# Patient Record
Sex: Female | Born: 1955 | Hispanic: Yes | Marital: Married | Smoking: Never smoker
Health system: Southern US, Community
[De-identification: ages and names within clinical notes are randomized; demographics above are authoritative.]

## PROBLEM LIST (undated history)

## (undated) DIAGNOSIS — E785 Hyperlipidemia, unspecified: Secondary | ICD-10-CM

## (undated) DIAGNOSIS — C569 Malignant neoplasm of unspecified ovary: Secondary | ICD-10-CM

## (undated) DIAGNOSIS — G43909 Migraine, unspecified, not intractable, without status migrainosus: Secondary | ICD-10-CM

## (undated) DIAGNOSIS — I10 Essential (primary) hypertension: Secondary | ICD-10-CM

## (undated) DIAGNOSIS — E119 Type 2 diabetes mellitus without complications: Secondary | ICD-10-CM

## (undated) HISTORY — DX: Type 2 diabetes mellitus without complications: E11.9

## (undated) HISTORY — DX: Hyperlipidemia, unspecified: E78.5

## (undated) HISTORY — DX: Essential (primary) hypertension: I10

## (undated) HISTORY — DX: Migraine, unspecified, not intractable, without status migrainosus: G43.909

---

## 1988-04-10 HISTORY — PX: BREAST BIOPSY: SHX20

## 2003-04-11 DIAGNOSIS — C569 Malignant neoplasm of unspecified ovary: Secondary | ICD-10-CM

## 2003-04-11 HISTORY — PX: ABDOMINAL HYSTERECTOMY: SHX81

## 2003-04-11 HISTORY — DX: Malignant neoplasm of unspecified ovary: C56.9

## 2005-03-01 ENCOUNTER — Ambulatory Visit: Payer: Self-pay | Admitting: Gynecologic Oncology

## 2005-03-08 ENCOUNTER — Other Ambulatory Visit: Payer: Self-pay

## 2005-03-15 ENCOUNTER — Inpatient Hospital Stay: Payer: Self-pay | Admitting: Unknown Physician Specialty

## 2005-03-29 ENCOUNTER — Ambulatory Visit: Payer: Self-pay | Admitting: Gynecologic Oncology

## 2005-04-12 ENCOUNTER — Ambulatory Visit: Payer: Self-pay | Admitting: Internal Medicine

## 2005-05-11 ENCOUNTER — Ambulatory Visit: Payer: Self-pay | Admitting: Internal Medicine

## 2005-06-08 ENCOUNTER — Ambulatory Visit: Payer: Self-pay | Admitting: Internal Medicine

## 2005-07-09 ENCOUNTER — Ambulatory Visit: Payer: Self-pay | Admitting: Internal Medicine

## 2005-08-08 ENCOUNTER — Ambulatory Visit: Payer: Self-pay | Admitting: Internal Medicine

## 2005-09-19 ENCOUNTER — Ambulatory Visit: Payer: Self-pay | Admitting: Internal Medicine

## 2005-10-08 ENCOUNTER — Ambulatory Visit: Payer: Self-pay | Admitting: Internal Medicine

## 2005-11-14 ENCOUNTER — Ambulatory Visit: Payer: Self-pay | Admitting: Internal Medicine

## 2005-12-09 ENCOUNTER — Ambulatory Visit: Payer: Self-pay | Admitting: Internal Medicine

## 2005-12-26 ENCOUNTER — Ambulatory Visit: Payer: Self-pay | Admitting: Gynecologic Oncology

## 2006-01-08 ENCOUNTER — Ambulatory Visit: Payer: Self-pay | Admitting: Internal Medicine

## 2006-02-08 ENCOUNTER — Ambulatory Visit: Payer: Self-pay | Admitting: Internal Medicine

## 2006-03-14 ENCOUNTER — Ambulatory Visit: Payer: Self-pay | Admitting: Internal Medicine

## 2006-04-10 ENCOUNTER — Ambulatory Visit: Payer: Self-pay | Admitting: Internal Medicine

## 2006-05-22 ENCOUNTER — Ambulatory Visit: Payer: Self-pay | Admitting: Internal Medicine

## 2006-06-09 ENCOUNTER — Ambulatory Visit: Payer: Self-pay | Admitting: Internal Medicine

## 2006-06-19 ENCOUNTER — Ambulatory Visit: Payer: Self-pay | Admitting: Internal Medicine

## 2006-07-31 ENCOUNTER — Ambulatory Visit: Payer: Self-pay | Admitting: Gynecologic Oncology

## 2006-08-09 ENCOUNTER — Ambulatory Visit: Payer: Self-pay | Admitting: Gynecologic Oncology

## 2006-11-09 ENCOUNTER — Ambulatory Visit: Payer: Self-pay | Admitting: Internal Medicine

## 2006-11-20 ENCOUNTER — Ambulatory Visit: Payer: Self-pay | Admitting: Internal Medicine

## 2006-12-10 ENCOUNTER — Ambulatory Visit: Payer: Self-pay | Admitting: Internal Medicine

## 2007-03-11 ENCOUNTER — Ambulatory Visit: Payer: Self-pay | Admitting: Internal Medicine

## 2007-03-25 ENCOUNTER — Ambulatory Visit: Payer: Self-pay | Admitting: Gynecologic Oncology

## 2007-03-26 ENCOUNTER — Ambulatory Visit: Payer: Self-pay | Admitting: Internal Medicine

## 2007-04-11 ENCOUNTER — Ambulatory Visit: Payer: Self-pay | Admitting: Internal Medicine

## 2007-05-12 ENCOUNTER — Ambulatory Visit: Payer: Self-pay | Admitting: Internal Medicine

## 2007-05-27 ENCOUNTER — Ambulatory Visit: Payer: Self-pay | Admitting: Internal Medicine

## 2007-06-09 ENCOUNTER — Ambulatory Visit: Payer: Self-pay | Admitting: Internal Medicine

## 2007-06-18 ENCOUNTER — Ambulatory Visit: Payer: Self-pay

## 2007-07-16 ENCOUNTER — Ambulatory Visit: Payer: Self-pay | Admitting: Internal Medicine

## 2007-07-23 ENCOUNTER — Ambulatory Visit: Payer: Self-pay

## 2007-08-09 ENCOUNTER — Ambulatory Visit: Payer: Self-pay | Admitting: Internal Medicine

## 2007-09-09 ENCOUNTER — Ambulatory Visit: Payer: Self-pay | Admitting: Internal Medicine

## 2007-09-26 ENCOUNTER — Ambulatory Visit: Payer: Self-pay | Admitting: Internal Medicine

## 2007-10-09 ENCOUNTER — Ambulatory Visit: Payer: Self-pay | Admitting: Internal Medicine

## 2007-11-28 ENCOUNTER — Ambulatory Visit: Payer: Self-pay | Admitting: Internal Medicine

## 2007-12-10 ENCOUNTER — Ambulatory Visit: Payer: Self-pay | Admitting: Internal Medicine

## 2008-01-27 ENCOUNTER — Ambulatory Visit: Payer: Self-pay | Admitting: Internal Medicine

## 2008-02-09 ENCOUNTER — Ambulatory Visit: Payer: Self-pay | Admitting: Internal Medicine

## 2008-03-10 ENCOUNTER — Ambulatory Visit: Payer: Self-pay | Admitting: Internal Medicine

## 2008-03-30 ENCOUNTER — Ambulatory Visit: Payer: Self-pay | Admitting: Internal Medicine

## 2008-04-10 ENCOUNTER — Ambulatory Visit: Payer: Self-pay | Admitting: Internal Medicine

## 2008-06-01 ENCOUNTER — Ambulatory Visit: Payer: Self-pay | Admitting: Internal Medicine

## 2008-06-08 ENCOUNTER — Ambulatory Visit: Payer: Self-pay | Admitting: Internal Medicine

## 2008-07-09 ENCOUNTER — Ambulatory Visit: Payer: Self-pay | Admitting: Internal Medicine

## 2008-08-03 ENCOUNTER — Ambulatory Visit: Payer: Self-pay | Admitting: Internal Medicine

## 2008-08-08 ENCOUNTER — Ambulatory Visit: Payer: Self-pay | Admitting: Internal Medicine

## 2008-10-08 ENCOUNTER — Ambulatory Visit: Payer: Self-pay | Admitting: Internal Medicine

## 2008-11-18 ENCOUNTER — Ambulatory Visit: Payer: Self-pay

## 2008-12-01 ENCOUNTER — Ambulatory Visit: Payer: Self-pay | Admitting: Internal Medicine

## 2008-12-09 ENCOUNTER — Ambulatory Visit: Payer: Self-pay | Admitting: Internal Medicine

## 2009-01-08 ENCOUNTER — Ambulatory Visit: Payer: Self-pay | Admitting: Internal Medicine

## 2009-02-02 ENCOUNTER — Ambulatory Visit: Payer: Self-pay | Admitting: Internal Medicine

## 2009-02-08 ENCOUNTER — Ambulatory Visit: Payer: Self-pay | Admitting: Internal Medicine

## 2009-03-10 ENCOUNTER — Ambulatory Visit: Payer: Self-pay | Admitting: Internal Medicine

## 2009-04-10 ENCOUNTER — Ambulatory Visit: Payer: Self-pay | Admitting: Internal Medicine

## 2009-05-02 ENCOUNTER — Emergency Department: Payer: Self-pay

## 2009-06-02 ENCOUNTER — Ambulatory Visit: Payer: Self-pay | Admitting: Internal Medicine

## 2009-06-08 ENCOUNTER — Ambulatory Visit: Payer: Self-pay | Admitting: Internal Medicine

## 2009-07-09 ENCOUNTER — Ambulatory Visit: Payer: Self-pay | Admitting: Internal Medicine

## 2009-08-02 ENCOUNTER — Ambulatory Visit: Payer: Self-pay | Admitting: Internal Medicine

## 2009-08-08 ENCOUNTER — Ambulatory Visit: Payer: Self-pay | Admitting: Internal Medicine

## 2009-11-01 ENCOUNTER — Ambulatory Visit: Payer: Self-pay | Admitting: Internal Medicine

## 2009-11-02 LAB — CA 125: CA 125: 15.9 U/mL (ref 0.0–34.0)

## 2009-11-08 ENCOUNTER — Ambulatory Visit: Payer: Self-pay | Admitting: Internal Medicine

## 2010-01-31 ENCOUNTER — Ambulatory Visit: Payer: Self-pay | Admitting: Internal Medicine

## 2010-02-08 ENCOUNTER — Ambulatory Visit: Payer: Self-pay | Admitting: Internal Medicine

## 2010-02-09 ENCOUNTER — Ambulatory Visit: Payer: Self-pay | Admitting: Unknown Physician Specialty

## 2010-06-16 ENCOUNTER — Ambulatory Visit: Payer: Self-pay | Admitting: Internal Medicine

## 2010-07-10 ENCOUNTER — Ambulatory Visit: Payer: Self-pay | Admitting: Internal Medicine

## 2010-10-03 ENCOUNTER — Ambulatory Visit: Payer: Self-pay | Admitting: Internal Medicine

## 2010-10-09 ENCOUNTER — Ambulatory Visit: Payer: Self-pay | Admitting: Internal Medicine

## 2011-02-01 ENCOUNTER — Ambulatory Visit: Payer: Self-pay

## 2011-02-01 ENCOUNTER — Ambulatory Visit: Payer: Self-pay | Admitting: Internal Medicine

## 2011-02-02 LAB — CA 125: CA 125: 14.9 U/mL (ref 0.0–34.0)

## 2011-02-09 ENCOUNTER — Ambulatory Visit: Payer: Self-pay | Admitting: Internal Medicine

## 2011-02-24 ENCOUNTER — Encounter: Payer: Self-pay | Admitting: Internal Medicine

## 2011-06-07 ENCOUNTER — Ambulatory Visit: Payer: Self-pay | Admitting: Internal Medicine

## 2011-06-08 LAB — CA 125: CA 125: 18 U/mL (ref 0.0–34.0)

## 2011-06-09 ENCOUNTER — Ambulatory Visit: Payer: Self-pay | Admitting: Internal Medicine

## 2011-10-04 ENCOUNTER — Ambulatory Visit: Payer: Self-pay | Admitting: Internal Medicine

## 2011-10-05 LAB — CA 125: CA 125: 17.1 U/mL (ref 0.0–34.0)

## 2011-10-09 ENCOUNTER — Ambulatory Visit: Payer: Self-pay | Admitting: Internal Medicine

## 2012-02-08 ENCOUNTER — Ambulatory Visit: Payer: Self-pay | Admitting: Internal Medicine

## 2012-02-08 LAB — SEDIMENTATION RATE: Erythrocyte Sed Rate: 9 mm/hr (ref 0–30)

## 2012-02-08 LAB — CBC CANCER CENTER
Basophil #: 0 x10 3/mm (ref 0.0–0.1)
Eosinophil #: 0.1 x10 3/mm (ref 0.0–0.7)
Eosinophil %: 1.5 %
HCT: 39.2 % (ref 35.0–47.0)
HGB: 13.1 g/dL (ref 12.0–16.0)
Lymphocyte #: 1.7 x10 3/mm (ref 1.0–3.6)
MCHC: 33.5 g/dL (ref 32.0–36.0)
MCV: 81 fL (ref 80–100)
Monocyte %: 8.2 %
Platelet: 185 x10 3/mm (ref 150–440)
RBC: 4.82 10*6/uL (ref 3.80–5.20)
RDW: 14.3 % (ref 11.5–14.5)

## 2012-02-08 LAB — COMPREHENSIVE METABOLIC PANEL
Alkaline Phosphatase: 74 U/L (ref 50–136)
Anion Gap: 6 — ABNORMAL LOW (ref 7–16)
Calcium, Total: 8.9 mg/dL (ref 8.5–10.1)
Chloride: 106 mmol/L (ref 98–107)
Co2: 31 mmol/L (ref 21–32)
EGFR (African American): >60
Potassium: 4.2 mmol/L (ref 3.5–5.1)
SGOT(AST): 14 U/L — ABNORMAL LOW (ref 15–37)
Total Protein: 7.4 g/dL (ref 6.4–8.2)

## 2012-02-09 ENCOUNTER — Ambulatory Visit: Payer: Self-pay | Admitting: Internal Medicine

## 2012-02-09 LAB — CA 125: CA 125: 18.6 U/mL (ref 0.0–34.0)

## 2012-03-10 ENCOUNTER — Ambulatory Visit: Payer: Self-pay | Admitting: Internal Medicine

## 2012-05-11 ENCOUNTER — Ambulatory Visit: Payer: Self-pay | Admitting: Internal Medicine

## 2012-05-30 LAB — CA 125: CA 125: 19 U/mL (ref 0.0–34.0)

## 2012-06-08 ENCOUNTER — Ambulatory Visit: Payer: Self-pay | Admitting: Internal Medicine

## 2012-09-19 ENCOUNTER — Ambulatory Visit: Payer: Self-pay | Admitting: Internal Medicine

## 2012-10-08 ENCOUNTER — Ambulatory Visit: Payer: Self-pay | Admitting: Internal Medicine

## 2013-02-06 ENCOUNTER — Ambulatory Visit: Payer: Self-pay | Admitting: Internal Medicine

## 2013-02-07 LAB — CA 125: CA 125: 19.6 U/mL (ref 0.0–34.0)

## 2013-02-08 ENCOUNTER — Ambulatory Visit: Payer: Self-pay | Admitting: Internal Medicine

## 2013-06-04 ENCOUNTER — Ambulatory Visit: Payer: Self-pay | Admitting: Internal Medicine

## 2013-06-10 ENCOUNTER — Ambulatory Visit: Payer: Self-pay | Admitting: Internal Medicine

## 2013-06-11 LAB — CA 125: CA 125: 17 U/mL (ref 0.0–34.0)

## 2013-06-26 DIAGNOSIS — C569 Malignant neoplasm of unspecified ovary: Secondary | ICD-10-CM | POA: Insufficient documentation

## 2013-06-26 DIAGNOSIS — H409 Unspecified glaucoma: Secondary | ICD-10-CM | POA: Insufficient documentation

## 2013-06-26 DIAGNOSIS — I10 Essential (primary) hypertension: Secondary | ICD-10-CM | POA: Insufficient documentation

## 2013-07-09 ENCOUNTER — Ambulatory Visit: Payer: Self-pay | Admitting: Internal Medicine

## 2013-07-29 ENCOUNTER — Ambulatory Visit: Payer: Self-pay | Admitting: Family Medicine

## 2013-09-16 ENCOUNTER — Ambulatory Visit: Payer: Self-pay | Admitting: Family Medicine

## 2013-10-02 ENCOUNTER — Ambulatory Visit: Payer: Self-pay | Admitting: Internal Medicine

## 2013-10-03 LAB — CA 125: CA 125: 17.6 U/mL (ref 0.0–34.0)

## 2013-10-08 ENCOUNTER — Ambulatory Visit: Payer: Self-pay | Admitting: Internal Medicine

## 2014-02-03 ENCOUNTER — Ambulatory Visit: Payer: Self-pay | Admitting: Internal Medicine

## 2014-02-04 LAB — CA 125: CA 125: 18.6 U/mL (ref 0.0–34.0)

## 2014-02-08 ENCOUNTER — Ambulatory Visit: Payer: Self-pay | Admitting: Internal Medicine

## 2014-10-27 ENCOUNTER — Telehealth: Payer: Self-pay | Admitting: *Deleted

## 2014-10-27 ENCOUNTER — Encounter: Payer: Self-pay | Admitting: *Deleted

## 2014-10-27 NOTE — Telephone Encounter (Signed)
Letter of Release from Practice and patient's last Office Note faxed to Dr. Nadine Counts at [facility].Marland KitchenMarland Kitchen

## 2014-12-16 ENCOUNTER — Other Ambulatory Visit: Payer: Self-pay | Admitting: Family Medicine

## 2014-12-16 DIAGNOSIS — G43009 Migraine without aura, not intractable, without status migrainosus: Secondary | ICD-10-CM

## 2014-12-17 ENCOUNTER — Other Ambulatory Visit: Payer: Self-pay | Admitting: Family Medicine

## 2014-12-17 DIAGNOSIS — I10 Essential (primary) hypertension: Secondary | ICD-10-CM

## 2014-12-22 ENCOUNTER — Ambulatory Visit (INDEPENDENT_AMBULATORY_CARE_PROVIDER_SITE_OTHER): Payer: BLUE CROSS/BLUE SHIELD | Admitting: Family Medicine

## 2014-12-22 ENCOUNTER — Encounter: Payer: Self-pay | Admitting: Family Medicine

## 2014-12-22 VITALS — BP 128/70 | HR 74 | Temp 98.6°F | Resp 16 | Ht 62.0 in | Wt 161.0 lb

## 2014-12-22 DIAGNOSIS — I10 Essential (primary) hypertension: Secondary | ICD-10-CM | POA: Diagnosis not present

## 2014-12-22 DIAGNOSIS — Z0000 Encounter for general adult medical examination without abnormal findings: Secondary | ICD-10-CM | POA: Insufficient documentation

## 2014-12-22 DIAGNOSIS — E8881 Metabolic syndrome: Secondary | ICD-10-CM

## 2014-12-22 DIAGNOSIS — Z1239 Encounter for other screening for malignant neoplasm of breast: Secondary | ICD-10-CM | POA: Insufficient documentation

## 2014-12-22 DIAGNOSIS — R1011 Right upper quadrant pain: Secondary | ICD-10-CM

## 2014-12-22 DIAGNOSIS — E119 Type 2 diabetes mellitus without complications: Secondary | ICD-10-CM | POA: Diagnosis not present

## 2014-12-22 DIAGNOSIS — M25569 Pain in unspecified knee: Secondary | ICD-10-CM | POA: Insufficient documentation

## 2014-12-22 DIAGNOSIS — G43009 Migraine without aura, not intractable, without status migrainosus: Secondary | ICD-10-CM | POA: Diagnosis not present

## 2014-12-22 DIAGNOSIS — Z113 Encounter for screening for infections with a predominantly sexual mode of transmission: Secondary | ICD-10-CM | POA: Insufficient documentation

## 2014-12-22 DIAGNOSIS — E7800 Pure hypercholesterolemia, unspecified: Secondary | ICD-10-CM | POA: Insufficient documentation

## 2014-12-22 DIAGNOSIS — E785 Hyperlipidemia, unspecified: Secondary | ICD-10-CM

## 2014-12-22 DIAGNOSIS — Z789 Other specified health status: Secondary | ICD-10-CM | POA: Insufficient documentation

## 2014-12-22 DIAGNOSIS — Z8543 Personal history of malignant neoplasm of ovary: Secondary | ICD-10-CM | POA: Insufficient documentation

## 2014-12-22 DIAGNOSIS — Z1211 Encounter for screening for malignant neoplasm of colon: Secondary | ICD-10-CM | POA: Insufficient documentation

## 2014-12-22 DIAGNOSIS — M1710 Unilateral primary osteoarthritis, unspecified knee: Secondary | ICD-10-CM | POA: Insufficient documentation

## 2014-12-22 DIAGNOSIS — Z23 Encounter for immunization: Secondary | ICD-10-CM

## 2014-12-22 DIAGNOSIS — S29011A Strain of muscle and tendon of front wall of thorax, initial encounter: Secondary | ICD-10-CM | POA: Insufficient documentation

## 2014-12-22 DIAGNOSIS — M179 Osteoarthritis of knee, unspecified: Secondary | ICD-10-CM | POA: Insufficient documentation

## 2014-12-22 DIAGNOSIS — O2690 Pregnancy related conditions, unspecified, unspecified trimester: Secondary | ICD-10-CM | POA: Insufficient documentation

## 2014-12-22 DIAGNOSIS — R87610 Atypical squamous cells of undetermined significance on cytologic smear of cervix (ASC-US): Secondary | ICD-10-CM | POA: Insufficient documentation

## 2014-12-22 HISTORY — DX: Hyperlipidemia, unspecified: E78.5

## 2014-12-22 MED ORDER — ATORVASTATIN CALCIUM 20 MG PO TABS: 20.0000 mg | ORAL_TABLET | Freq: Every day | ORAL | Status: DC

## 2014-12-22 MED ORDER — LISINOPRIL-HYDROCHLOROTHIAZIDE 10-12.5 MG PO TABS: 1.0000 | ORAL_TABLET | Freq: Every day | ORAL | Status: DC

## 2014-12-22 MED ORDER — SUMATRIPTAN SUCCINATE 50 MG PO TABS
50.0000 mg | ORAL_TABLET | Freq: Once | ORAL | Status: DC
Start: 2014-12-22 — End: 2016-08-11

## 2014-12-22 MED ORDER — METFORMIN HCL 500 MG PO TABS: 500.0000 mg | ORAL_TABLET | Freq: Every day | ORAL | Status: DC

## 2014-12-22 NOTE — Progress Notes (Signed)
Name: Jackie Fox   MRN: [id number]    DOB: 09/12/1955   Date:12/22/2014       Progress Note  Subjective  Chief Complaint  Chief Complaint  Patient presents with  . Medication Refill    patient needs a refill of her metformin, imitrex and lisinopril-hctz    HPI  Jackie Fox is a 59 year old Spanish speaking female with a past medical history significant for obesity, Diabetes Type II, HLD, HTN, Migraine headaches, history of ovarian cancer. She was first diagnosed with Diabetes Type II several years ago and has had a hard time with consistent glycemic control due to compliance. Today she reports using her Metformin daily. Her diet is not consistent with a diabetic diet. Related symptoms include fatigue and do not include paresthesia, polydipsia, polyuria, wounds, ulcers and gastroparesis. Current treatment includes Metformin 500 mg one a day. By report there is consistent compliance with treatment medications and she is still struggling with diet. Last diabetic eye exam is unknown. For her Migraine headaches she uses Sumatriptan as needed which is working well. For her HTN she is taking lisinopril-hctz 10-12.5 mg one a day with no reported chest pain, palpitations, edema. For her HLD she was previously prescribed a statin medication but has since self discontinued the medication it seems.  Today she complains of intermitent RUQ pain mostly with eating cheese. Not associated with nausea or vomiting. Onset several months ago. Non radiation. Pain described as achy cramps. No change in stool color or content.   Patient Active Problem List   Diagnosis Date Noted  . Atypical squamous cells of undetermined significance (ASC-US) on cervical Pap smear 12/22/2014  . Controlled diabetes mellitus type II without complication 62/94/7654  . H/O ovarian cancer 12/22/2014  . Hypercholesteremia 12/22/2014  . Dysmetabolic syndrome 65/06/5463  . Migraine without aura and responsive to treatment 12/22/2014   . Arthritis of knee, degenerative 12/22/2014  . Gonalgia 12/22/2014  . Glaucoma 06/26/2013  . Hypertension goal BP (blood pressure) < 140/90 06/26/2013  . Cancer of ovary 06/26/2013    Social History  Substance Use Topics  . Smoking status: Never Smoker   . Smokeless tobacco: Not on file  . Alcohol Use: No     Current outpatient prescriptions:  .  glucose blood (BAYER CONTOUR TEST) test strip, BAYER CONTOUR TEST (In Vitro Strip)  1 (one) Strip two times daily, as needed for 30 days  Quantity: 60;  Refills: 5   Ordered :30-Jun-2014  Bobetta Lime MD;  Started 30-Jun-2014 Active Comments: Medication taken as needed. , Disp: , Rfl:  .  lisinopril-hydrochlorothiazide (PRINZIDE,ZESTORETIC) 10-12.5 MG per tablet, TAKE 1 TABLET BY MOUTH EVERY DAY, Disp: 90 tablet, Rfl: 2 .  metFORMIN (GLUCOPHAGE) 500 MG tablet, , Disp: , Rfl: 4 .  SUMAtriptan (IMITREX) 50 MG tablet, TAKE 1 TAB BY MOUTH AS NEEDED FOR HEADACHE MAY REPEAT ONCE AFTER 2 HOURS, Disp: 20 tablet, Rfl: 5 .  timolol (BETIMOL) 0.5 % ophthalmic solution, 1 drop 2 (two) times daily., Disp: , Rfl:   Past Surgical History  Procedure Laterality Date  . Abdominal hysterectomy      No family history on file.  Allergies  Allergen Reactions  . Ibuprofen Swelling  . Shrimp [Shellfish Allergy]      Review of Systems  CONSTITUTIONAL: No significant weight changes, fever, chills, weakness or fatigue.  HEENT:  - Eyes: No visual changes.  - Ears: No auditory changes. No pain.  - Nose: No sneezing, congestion, runny nose. - Throat:  No sore throat. No changes in swallowing. SKIN: No rash or itching.  CARDIOVASCULAR: No chest pain, chest pressure or chest discomfort. No palpitations or edema.  RESPIRATORY: No shortness of breath, cough or sputum.  GASTROINTESTINAL: No anorexia, nausea, vomiting. No changes in bowel habits. RUQ abdominal pain. GENITOURINARY: No dysuria. No frequency. No discharge. NEUROLOGICAL: No headache,  dizziness, syncope, paralysis, ataxia, numbness or tingling in the extremities. No memory changes. No change in bowel or bladder control.  MUSCULOSKELETAL: No joint pain. No muscle pain. HEMATOLOGIC: No anemia, bleeding or bruising.  LYMPHATICS: No enlarged lymph nodes.  PSYCHIATRIC: No change in mood. No change in sleep pattern.  ENDOCRINOLOGIC: No reports of sweating, cold or heat intolerance. No polyuria or polydipsia.     Objective  BP 128/70 mmHg  Pulse 74  Temp(Src) 98.6 F (37 C) (Oral)  Resp 16  Ht 5\' 2"  (1.575 m)  Wt 161 lb (73.029 kg)  BMI 29.44 kg/m2  SpO2 97% Body mass index is 29.44 kg/(m^2).  Physical Exam  Constitutional: Patient overweight and well-nourished. In no distress.  HEENT:  - Head: Normocephalic and atraumatic.  - Ears: Bilateral TMs gray, no erythema or effusion - Nose: Nasal mucosa moist - Mouth/Throat: Oropharynx is clear and moist. No tonsillar hypertrophy or erythema. No post nasal drainage.  - Eyes: Conjunctivae clear, EOM movements normal. PERRLA. No scleral icterus.  Neck: Normal range of motion. Neck supple. No JVD present. No thyromegaly present.  Cardiovascular: Normal rate, regular rhythm and normal heart sounds.  No murmur heard.  Pulmonary/Chest: Effort normal and breath sounds normal. No respiratory distress. Abdomen: Soft, non tender, non distended (obese however with waist >35 inches), no reproducible pain in any quadrant, normal bowel sounds in all four quadrants.  Musculoskeletal: Normal range of motion bilateral UE and LE, no joint effusions. Peripheral vascular: Bilateral LE no edema. Neurological: CN II-XII grossly intact with no focal deficits. Alert and oriented to person, place, and time. Coordination, balance, strength, speech and gait are normal.  Skin: Skin is warm and dry. No rash noted. No erythema.  Psychiatric: Patient has a normal mood and affect. Behavior is normal in office today. Judgment and thought content normal  in office today.  Diabetic Foot Exam - Simple   Simple Foot Form  Diabetic Foot exam was performed with the following findings:  Yes 12/22/2014  8:39 AM  Visual Inspection  No deformities, no ulcerations, no other skin breakdown bilaterally:  Yes  Sensation Testing  Intact to touch and monofilament testing bilaterally:  Yes  Pulse Check  Posterior Tibialis and Dorsalis pulse intact bilaterally:  Yes  Comments      Assessment & Plan  1. Controlled diabetes mellitus type II without complication Patient's ZOX0R goal is <6.5% for stringent control.  Patient's Hba1c goal is <7% is acceptable   Encouraged patient to continue efforts on checking blood glucose on a daily basis. Fasting blood glucose control goal is 80-130mg /dL and post prandial blood glucose control is 180mg /dL.  Reviewed diet, exercise, lifestyle changes and current medication regimen pertaining to diabetes with the patient.   Reminded patient of the required annual dilated retinal exam.    - Hemoglobin A1c - TSH - metFORMIN (GLUCOPHAGE) 500 MG tablet; Take 1 tablet (500 mg total) by mouth daily.  Dispense: 90 tablet; Refill: 3  2. Hypertension goal BP (blood pressure) < 140/90 Clinically stable findings based on clinical exam and on review of any pertinent results. Recommended to patient that they continue their current regimen  with regular follow ups.  - CBC with Differential/Platelet - Comprehensive metabolic panel - TSH - lisinopril-hydrochlorothiazide (PRINZIDE,ZESTORETIC) 10-12.5 MG per tablet; Take 1 tablet by mouth daily.  Dispense: 90 tablet; Refill: 3  3. Hyperlipidemia LDL goal <100  - Lipid panel - TSH - atorvastatin (LIPITOR) 20 MG tablet; Take 1 tablet (20 mg total) by mouth daily.  Dispense: 90 tablet; Refill: 3  4. Dysmetabolic syndrome  - TSH  5. Screening for STD (sexually transmitted disease)  - HIV antibody - Hepatitis C antibody  6. Migraine without aura and without status  migrainosus, not intractable Clinically stable findings based on clinical exam and on review of any pertinent results. Recommended to patient that they continue their current regimen with regular follow ups.  - SUMAtriptan (IMITREX) 50 MG tablet; Take 1 tablet (50 mg total) by mouth once. May repeat in 2 hours if headache persists or recurs.  Dispense: 10 tablet; Refill: 5  7. Need for immunization against influenza  - Flu Vaccine QUAD 36+ mos PF IM (Fluarix & Fluzone Quad PF)  8. RUQ abdominal pain Likely gallbladder pathology, non persistent, recommended diet changes. If symptoms progress RTC for further testing.

## 2014-12-22 NOTE — Patient Instructions (Signed)
Colelitiasis (Cholelithiasis) La colelitiasis (tambin llamada clculos en la vescula) es una enfermedad en la que se forman piedras en la vescula. La vescula es un rgano que almacena la bilis que se forma en el hgado y que ayuda a Licensed conveyancer. Los clculos comienzan como pequeos cristales y lentamente se transforman en piedras. El dolor en la vescula ocurre cuando se producen espasmos y los clculos obstruyen el conducto. El dolor tambin se produce cuando una piedra sale por el conducto.  FACTORES DE RIESGO  Ser mujer.   Tener embarazos mltiples. Algunas veces los mdicos aconsejan extirpar los clculos biliares antes de futuros embarazos.   Ser obeso.  Dietas que incluyan comidas fritas y grasas.   Ser mayor de 44 aos y el aumento de la edad.   El uso prolongado de medicamentos que contengan hormonas femeninas.   Tener diabetes mellitus.   Prdida rpida de peso.   Historia familiar de clculos (herencia).  SNTOMAS  Nuseas.   Vmitos.  Dolor abdominal.   Piel amarilla (ictericia)   Dolor sbito. Puede persistir desde algunos minutos hasta algunas horas.  Cristy Hilts.   Sensibilidad al tacto. En algunos casos, cuando los clculos biliares no se mueven hacia el conducto biliar, las personas no sienten dolor ni presentan sntomas. Estos se denominan clculos "silenciosos".  TRATAMIENTO Los clculos silenciosos no requieren Clinical research associate. En los [employer], podr ser [place]. Las opciones de tratamiento son:  Dwaine Gale para extirpar la vescula. Es el tratamiento ms frecuente.  Medicamentos. No siempre dan resultado y pueden demorar entre 6 y 8 meses o ms en Chief of Staff.  Tratamiento con ondas de choque (litotricia biliar extracorporal). En este tratamiento, una mquina de ultrasonido enva ondas de choque a la vescula para destruir los clculos en pequeos fragmentos que luego podrn pasar a los intestinos o ser disueltas  con medicamentos. INSTRUCCIONES PARA EL CUIDADO EN EL HOGAR   Slo tome medicamentos de venta libre o recetados para Glass blower/designer, Health and safety inspector o bajar la fiebre, segn las indicaciones de su mdico.   Siga una dieta baja en grasas hasta que su mdico lo vea nuevamente. Las grasas hacen que la vescula se Location manager, lo que puede Orthoptist.   Concurra a las consultas de control con su mdico segn las indicaciones. Los ataques casi siempre son recurrentes y generalmente habr que someterse a una ciruga como [place].  SOLICITE ATENCIN MDICA DE INMEDIATO SI:   El dolor aumenta y no puede controlarlo con los medicamentos.   Tiene fiebre o sntomas persistentes durante ms de 2 - 3 das.   Tiene fiebre y los sntomas empeoran repentinamente.   Tiene nuseas o vmitos persistentes.  ASEGRESE DE QUE:   Comprende estas instrucciones.  Controlar su afeccin.  Recibir ayuda de inmediato si no mejora o si empeora. Document Released: 01/11/2006 Document Revised: 11/27/2012 [facility] Patient Information 2015 [place]. This information is not intended to replace advice given to you by your health care provider. Make sure you discuss any questions you have with your health care provider.

## 2014-12-30 LAB — CBC WITH DIFFERENTIAL/PLATELET
BASOS: 0 %
Basophils Absolute: 0 10*3/uL (ref 0.0–0.2)
EOS (ABSOLUTE): 0.1 10*3/uL (ref 0.0–0.4)
EOS: 2 %
HEMATOCRIT: 39.1 % (ref 34.0–46.6)
HEMOGLOBIN: 13.2 g/dL (ref 11.1–15.9)
Immature Grans (Abs): 0 10*3/uL (ref 0.0–0.1)
Immature Granulocytes: 0 %
LYMPHS ABS: 1.7 10*3/uL (ref 0.7–3.1)
Lymphs: 22 %
MCH: 27.9 pg (ref 26.6–33.0)
MCHC: 33.8 g/dL (ref 31.5–35.7)
MCV: 83 fL (ref 79–97)
MONOCYTES: 8 %
Monocytes Absolute: 0.6 10*3/uL (ref 0.1–0.9)
NEUTROS ABS: 5.1 10*3/uL (ref 1.4–7.0)
Neutrophils: 68 %
Platelets: 233 10*3/uL (ref 150–379)
RBC: 4.73 x10E6/uL (ref 3.77–5.28)
RDW: 15 % (ref 12.3–15.4)
WBC: 7.5 10*3/uL (ref 3.4–10.8)

## 2014-12-30 LAB — COMPREHENSIVE METABOLIC PANEL
ALBUMIN: 4 g/dL (ref 3.6–4.8)
ALK PHOS: 64 IU/L (ref 39–117)
ALT: 15 IU/L (ref 0–32)
AST: 15 IU/L (ref 0–40)
Albumin/Globulin Ratio: 1.5 (ref 1.1–2.5)
BILIRUBIN TOTAL: 0.6 mg/dL (ref 0.0–1.2)
BUN / CREAT RATIO: 26 (ref 11–26)
BUN: 12 mg/dL (ref 8–27)
CO2: 27 mmol/L (ref 18–29)
CREATININE: 0.46 mg/dL — AB (ref 0.57–1.00)
Calcium: 9.5 mg/dL (ref 8.7–10.3)
Chloride: 104 mmol/L (ref 97–108)
GFR calc non Af Amer: 108 mL/min/{1.73_m2} (ref >59)
GFR, EST AFRICAN AMERICAN: 124 mL/min/{1.73_m2} (ref >59)
GLOBULIN, TOTAL: 2.7 g/dL (ref 1.5–4.5)
Glucose: 104 mg/dL — ABNORMAL HIGH (ref 65–99)
Potassium: 5 mmol/L (ref 3.5–5.2)
SODIUM: 145 mmol/L — AB (ref 134–144)
TOTAL PROTEIN: 6.7 g/dL (ref 6.0–8.5)

## 2014-12-30 LAB — LIPID PANEL
CHOL/HDL RATIO: 4.2 ratio (ref 0.0–4.4)
Cholesterol, Total: 208 mg/dL — ABNORMAL HIGH (ref 100–199)
HDL: 49 mg/dL (ref >39)
LDL CALC: 134 mg/dL — AB (ref 0–99)
Triglycerides: 126 mg/dL (ref 0–149)
VLDL Cholesterol Cal: 25 mg/dL (ref 5–40)

## 2014-12-30 LAB — TSH: TSH: 3.24 u[IU]/mL (ref 0.450–4.500)

## 2014-12-30 LAB — HEMOGLOBIN A1C
Est. average glucose Bld gHb Est-mCnc: 134 mg/dL
HEMOGLOBIN A1C: 6.3 % — AB (ref 4.8–5.6)

## 2014-12-30 LAB — HIV ANTIBODY (ROUTINE TESTING W REFLEX): HIV Screen 4th Generation wRfx: NONREACTIVE

## 2014-12-30 LAB — HEPATITIS C ANTIBODY: Hep C Virus Ab: <0.1 s/co ratio (ref 0.0–0.9)

## 2015-03-23 ENCOUNTER — Encounter: Payer: Self-pay | Admitting: Family Medicine

## 2015-03-23 ENCOUNTER — Ambulatory Visit (INDEPENDENT_AMBULATORY_CARE_PROVIDER_SITE_OTHER): Payer: BLUE CROSS/BLUE SHIELD | Admitting: Family Medicine

## 2015-03-23 VITALS — BP 112/72 | HR 72 | Temp 98.4°F | Resp 12 | Wt 164.2 lb

## 2015-03-23 DIAGNOSIS — N390 Urinary tract infection, site not specified: Secondary | ICD-10-CM | POA: Insufficient documentation

## 2015-03-23 DIAGNOSIS — E785 Hyperlipidemia, unspecified: Secondary | ICD-10-CM

## 2015-03-23 DIAGNOSIS — E119 Type 2 diabetes mellitus without complications: Secondary | ICD-10-CM

## 2015-03-23 DIAGNOSIS — I10 Essential (primary) hypertension: Secondary | ICD-10-CM

## 2015-03-23 DIAGNOSIS — Z1231 Encounter for screening mammogram for malignant neoplasm of breast: Secondary | ICD-10-CM | POA: Diagnosis not present

## 2015-03-23 LAB — POCT URINALYSIS DIPSTICK
Bilirubin, UA: NEGATIVE
Glucose, UA: NEGATIVE
KETONES UA: NEGATIVE
Nitrite, UA: NEGATIVE
PROTEIN UA: NEGATIVE
SPEC GRAV UA: 1.015
UROBILINOGEN UA: 0.2
pH, UA: 6.5

## 2015-03-23 LAB — POCT UA - MICROALBUMIN: MICROALBUMIN (UR) POC: 50 mg/L

## 2015-03-23 MED ORDER — CIPROFLOXACIN HCL 500 MG PO TABS: 500.0000 mg | ORAL_TABLET | Freq: Two times a day (BID) | ORAL | Status: DC

## 2015-03-23 NOTE — Progress Notes (Signed)
Name: Jackie Fox   MRN: [id number]    DOB: [birth year]   Date:03/23/2015       Progress Note  Subjective  Chief Complaint  Chief Complaint  Patient presents with  . Diabetes    patient is here for her 4 month f/u  . Hypertension  . Hyperlipidemia  . Urinary Tract Infection    since Saturday patient reports of pain, pressure and burning upon urination  . Medication Refill    HPI  Jackie Fox is a 59 year old Spanish speaking female with a past medical history significant for obesity, Diabetes Type II, HLD, HTN, Migraine headaches, history of ovarian cancer. She was first diagnosed with Diabetes Type II several years ago and has had a hard time with consistent glycemic control due to compliance. Today she reports using her Metformin daily. Her diet is not consistent with a diabetic diet. Related symptoms include fatigue and do not include paresthesia, polydipsia, polyuria, wounds, ulcers and gastroparesis. Current treatment includes Metformin 500 mg one a day. By report there is consistent compliance with treatment medications and she is better with diet. Blood glucose ranges 80-95 in the am and after meals up to 140. Last diabetic eye exam is unknown. For her Migraine headaches she uses Sumatriptan as needed which is working well. For her HTN she is taking lisinopril-hctz 10-12.5 mg one a day with no reported chest pain, palpitations, edema. For her HLD she is prescribed atorvastatin 20 mg a day, not taking it, patient doesn't want it. Today she complains of dysuria, pelvic pressure and pain. No fevers or flank pain.  Past Medical History  Diagnosis Date  . Hypertension   . Migraine   . Diabetes mellitus without complication [facility])     Patient Active Problem List   Diagnosis Date Noted  . Atypical squamous cells of undetermined significance (ASC-US) on cervical Pap smear 12/22/2014  . Controlled diabetes mellitus type II without complication ([place]) 0000000  . H/O ovarian  cancer 12/22/2014  . Dysmetabolic syndrome 0000000  . Migraine without aura and responsive to treatment 12/22/2014  . Arthritis of knee, degenerative 12/22/2014  . Gonalgia 12/22/2014  . Hyperlipidemia LDL goal <100 12/22/2014  . Screening for STD (sexually transmitted disease) 12/22/2014  . Migraine without aura and without status migrainosus, not intractable 12/22/2014  . Need for immunization against influenza 12/22/2014  . RUQ abdominal pain 12/22/2014  . Glaucoma 06/26/2013  . Hypertension goal BP (blood pressure) < 140/90 06/26/2013  . Cancer of ovary ([place]) 06/26/2013    Social History  Substance Use Topics  . Smoking status: Never Smoker   . Smokeless tobacco: Not on file  . Alcohol Use: No     Current outpatient prescriptions:  .  atorvastatin (LIPITOR) 20 MG tablet, Take 1 tablet (20 mg total) by mouth daily., Disp: 90 tablet, Rfl: 3 .  glucose blood (BAYER CONTOUR TEST) test strip, BAYER CONTOUR TEST (In Vitro Strip)  1 (one) Strip two times daily, as needed for 30 days  Quantity: 60;  Refills: 5   Ordered :30-Jun-2014  Bobetta Lime MD;  Started 30-Jun-2014 Active Comments: Medication taken as needed. , Disp: , Rfl:  .  lisinopril-hydrochlorothiazide (PRINZIDE,ZESTORETIC) 10-12.5 MG per tablet, Take 1 tablet by mouth daily., Disp: 90 tablet, Rfl: 3 .  metFORMIN (GLUCOPHAGE) 500 MG tablet, Take 1 tablet (500 mg total) by mouth daily., Disp: 90 tablet, Rfl: 3 .  SUMAtriptan (IMITREX) 50 MG tablet, Take 1 tablet (50 mg total) by mouth once. May repeat  in 2 hours if headache persists or recurs. (Patient not taking: Reported on 03/23/2015), Disp: 10 tablet, Rfl: 5 .  timolol (BETIMOL) 0.5 % ophthalmic solution, 1 drop 2 (two) times daily., Disp: , Rfl:   Past Surgical History  Procedure Laterality Date  . Abdominal hysterectomy      History reviewed. No pertinent family history.  Allergies  Allergen Reactions  . Ibuprofen Swelling  . Shrimp [Shellfish Allergy]       Review of Systems  CONSTITUTIONAL: No significant weight changes, fever, chills, weakness or fatigue.  CARDIOVASCULAR: No chest pain, chest pressure or chest discomfort. No palpitations or edema.  RESPIRATORY: No shortness of breath, cough or sputum.  GASTROINTESTINAL: No anorexia, nausea, vomiting. No changes in bowel habits. No abdominal pain or blood.  GENITOURINARY: Yes dysuria. Yes frequency. No discharge. NEUROLOGICAL: No headache, dizziness, syncope, paralysis, ataxia, numbness or tingling in the extremities. No memory changes. No change in bowel or bladder control.  MUSCULOSKELETAL: No joint pain. No muscle pain. HEMATOLOGIC: No anemia, bleeding or bruising.  LYMPHATICS: No enlarged lymph nodes.  PSYCHIATRIC: No change in mood. No change in sleep pattern.  ENDOCRINOLOGIC: No reports of sweating, cold or heat intolerance. No polyuria or polydipsia.     Objective  BP 112/72 mmHg  Pulse 72  Temp(Src) 98.4 F (36.9 C) (Oral)  Resp 12  Wt 164 lb 3.2 oz (74.481 kg)  SpO2 98% Body mass index is 30.03 kg/(m^2).  Physical Exam  Constitutional: Patient appears well-developed and well-nourished. In no distress.  HEENT:  - Head: Normocephalic and atraumatic.  - Ears: Bilateral TMs gray, no erythema or effusion - Nose: Nasal mucosa moist - Mouth/Throat: Oropharynx is clear and moist. No tonsillar hypertrophy or erythema. No post nasal drainage.  - Eyes: Conjunctivae clear, EOM movements normal. PERRLA. No scleral icterus.  Neck: Normal range of motion. Neck supple. No JVD present. No thyromegaly present.  Cardiovascular: Normal rate, regular rhythm and normal heart sounds.  No murmur heard.  Pulmonary/Chest: Effort normal and breath sounds normal. No respiratory distress. Abdomen: Soft, normal bowel sounds in all four quadrants, some tenderness suprapubic area. Musculoskeletal: Normal range of motion bilateral UE and LE, no joint effusions. Peripheral vascular: Bilateral  LE no edema. Neurological: CN II-XII grossly intact with no focal deficits. Alert and oriented to person, place, and time. Coordination, balance, strength, speech and gait are normal.  Skin: Skin is warm and dry. No rash noted. No erythema.  Psychiatric: Patient has a normal mood and affect. Behavior is normal in office today. Judgment and thought content normal in office today.   Recent Results (from the past 2160 hour(s))  CBC with Differential/Platelet     Status: None   Collection Time: 12/29/14  9:45 AM  Result Value Ref Range   WBC 7.5 3.4 - 10.8 x10E3/uL   RBC 4.73 3.77 - 5.28 x10E6/uL   Hemoglobin 13.2 11.1 - 15.9 g/dL   Hematocrit 39.1 34.0 - 46.6 %   MCV 83 79 - 97 fL   MCH 27.9 26.6 - 33.0 pg   MCHC 33.8 31.5 - 35.7 g/dL   RDW 15.0 12.3 - 15.4 %   Platelets 233 150 - 379 x10E3/uL   Neutrophils 68 %   Lymphs 22 %   Monocytes 8 %   Eos 2 %   Basos 0 %   Neutrophils Absolute 5.1 1.4 - 7.0 x10E3/uL   Lymphocytes Absolute 1.7 0.7 - 3.1 x10E3/uL   Monocytes Absolute 0.6 0.1 - 0.9 x10E3/uL  EOS (ABSOLUTE) 0.1 0.0 - 0.4 x10E3/uL   Basophils Absolute 0.0 0.0 - 0.2 x10E3/uL   Immature Granulocytes 0 %   Immature Grans (Abs) 0.0 0.0 - 0.1 x10E3/uL  Comprehensive metabolic panel     Status: Abnormal   Collection Time: 12/29/14  9:45 AM  Result Value Ref Range   Glucose 104 (H) 65 - 99 mg/dL   BUN 12 8 - 27 mg/dL   Creatinine, Ser 0.46 (L) 0.57 - 1.00 mg/dL   GFR calc non Af Amer 108 >59 mL/min/1.73   GFR calc Af Amer 124 >59 mL/min/1.73   BUN/Creatinine Ratio 26 11 - 26   Sodium 145 (H) 134 - 144 mmol/L   Potassium 5.0 3.5 - 5.2 mmol/L   Chloride 104 97 - 108 mmol/L   CO2 27 18 - 29 mmol/L   Calcium 9.5 8.7 - 10.3 mg/dL   Total Protein 6.7 6.0 - 8.5 g/dL   Albumin 4.0 3.6 - 4.8 g/dL   Globulin, Total 2.7 1.5 - 4.5 g/dL   Albumin/Globulin Ratio 1.5 1.1 - 2.5   Bilirubin Total 0.6 0.0 - 1.2 mg/dL   Alkaline Phosphatase 64 39 - 117 IU/L   AST 15 0 - 40 IU/L   ALT 15 0  - 32 IU/L  Hemoglobin A1c     Status: Abnormal   Collection Time: 12/29/14  9:45 AM  Result Value Ref Range   Hgb A1c MFr Bld 6.3 (H) 4.8 - 5.6 %    Comment:          Pre-diabetes: 5.7 - 6.4          Diabetes: >6.4          Glycemic control for adults with diabetes: <7.0    Est. average glucose Bld gHb Est-mCnc 134 mg/dL  Lipid panel     Status: Abnormal   Collection Time: 12/29/14  9:45 AM  Result Value Ref Range   Cholesterol, Total 208 (H) 100 - 199 mg/dL   Triglycerides 126 0 - 149 mg/dL   HDL 49 >39 mg/dL    Comment: According to ATP-III Guidelines, HDL-C >59 mg/dL is considered a negative risk factor for CHD.    VLDL Cholesterol Cal 25 5 - 40 mg/dL   LDL Calculated 134 (H) 0 - 99 mg/dL   Chol/HDL Ratio 4.2 0.0 - 4.4 ratio units    Comment:                                   T. Chol/HDL Ratio                                             Men  Women                               1/2 Avg.Risk  3.4    3.3                                   Avg.Risk  5.0    4.4  2X Avg.Risk  9.6    7.1                                3X Avg.Risk 23.4   11.0   TSH     Status: None   Collection Time: 12/29/14  9:45 AM  Result Value Ref Range   TSH 3.240 0.450 - 4.500 uIU/mL  HIV antibody     Status: None   Collection Time: 12/29/14  9:45 AM  Result Value Ref Range   HIV Screen 4th Generation wRfx Non Reactive Non Reactive  Hepatitis C antibody     Status: None   Collection Time: 12/29/14  9:45 AM  Result Value Ref Range   Hep C Virus Ab <0.1 0.0 - 0.9 s/co ratio    Comment:                                   Negative:     < 0.8                              Indeterminate: 0.8 - 0.9                                   Positive:     > 0.9  The CDC recommends that a positive HCV antibody result  be followed up with a HCV Nucleic Acid Amplification  test [id number]).   POCT urinalysis dipstick     Status: Abnormal   Collection Time: 03/23/15  1:58 PM  Result Value Ref  Range   Color, UA yellow    Clarity, UA clear    Glucose, UA negative    Bilirubin, UA negative    Ketones, UA negative    Spec Grav, UA 1.015    Blood, UA moderate    pH, UA 6.5    Protein, UA negative    Urobilinogen, UA 0.2    Nitrite, UA negative    Leukocytes, UA small (1+) (A) Negative  POCT UA - Microalbumin     Status: Abnormal   Collection Time: 03/23/15  1:59 PM  Result Value Ref Range   Microalbumin Ur, POC 50 mg/L   Creatinine, POC  mg/dL   Albumin/Creatinine Ratio, Urine, POC       Assessment & Plan  1. Controlled type 2 diabetes mellitus without complication, without long-term current use of insulin ([place]) Lab work for Hba1c draw after 04/11/15 given today.    - POCT UA - Microalbumin - Hemoglobin A1c; Future - Hemoglobin A1c  2. UTI (lower urinary tract infection) Start Cipro, increase hydration.  - POCT urinalysis dipstick - Urine Culture - ciprofloxacin (CIPRO) 500 MG tablet; Take 1 tablet (500 mg total) by mouth 2 (two) times daily.  Dispense: 20 tablet; Refill: 0  3. Hyperlipidemia LDL goal <100 Patient refused statin.   4. Hypertension goal BP (blood pressure) < 140/90 Well controled.   5. Encounter for screening mammogram for malignant neoplasm of breast  - MM Digital Screening; Future

## 2015-03-25 LAB — URINE CULTURE: Organism ID, Bacteria: NO GROWTH

## 2015-03-29 ENCOUNTER — Telehealth: Payer: Self-pay | Admitting: Family Medicine

## 2015-03-29 NOTE — Telephone Encounter (Signed)
ERROR

## 2015-04-21 LAB — HEMOGLOBIN A1C
ESTIMATED AVERAGE GLUCOSE: 134 mg/dL
HEMOGLOBIN A1C: 6.3 % — AB (ref 4.8–5.6)

## 2015-05-11 ENCOUNTER — Ambulatory Visit
Admission: RE | Admit: 2015-05-11 | Discharge: 2015-05-11 | Disposition: A | Discharge status: Home / Self Care | Payer: BLUE CROSS/BLUE SHIELD | Source: Ambulatory Visit | Attending: Family Medicine | Admitting: Family Medicine

## 2015-05-11 DIAGNOSIS — Z1231 Encounter for screening mammogram for malignant neoplasm of breast: Secondary | ICD-10-CM | POA: Diagnosis not present

## 2015-05-11 HISTORY — DX: Malignant neoplasm of unspecified ovary: C56.9

## 2015-06-25 ENCOUNTER — Other Ambulatory Visit: Payer: Self-pay | Admitting: Family Medicine

## 2015-06-25 DIAGNOSIS — M25561 Pain in right knee: Secondary | ICD-10-CM

## 2015-06-25 DIAGNOSIS — M25562 Pain in left knee: Principal | ICD-10-CM

## 2015-09-21 ENCOUNTER — Ambulatory Visit: Payer: BLUE CROSS/BLUE SHIELD | Admitting: Family Medicine

## 2015-10-18 ENCOUNTER — Other Ambulatory Visit: Payer: Self-pay

## 2015-10-18 NOTE — Telephone Encounter (Signed)
Please let patient know that she does not need to check her sugars at this time We'll see her for her appointment (please schedule visit in the next month) No need for fingerstick blood sugars; I'll explain to her at our visit; thank you

## 2015-10-19 NOTE — Telephone Encounter (Signed)
Son [place]ne [place]) states she is out of the country on vacation but he will have her to give [place] a call when she returns later this week.

## 2016-02-02 ENCOUNTER — Other Ambulatory Visit: Payer: Self-pay

## 2016-02-02 DIAGNOSIS — E119 Type 2 diabetes mellitus without complications: Secondary | ICD-10-CM

## 2016-02-02 NOTE — Telephone Encounter (Signed)
Does patient need a refill?

## 2016-02-02 NOTE — Telephone Encounter (Signed)
Yes sorry

## 2016-02-03 MED ORDER — METFORMIN HCL 500 MG PO TABS: 500.0000 mg | ORAL_TABLET | Freq: Every day | ORAL | 0 refills | Status: DC

## 2016-02-03 NOTE — Telephone Encounter (Signed)
Last creatinine and A1c reviewed; upcoming appt in early Nov; Rx approved

## 2016-02-22 ENCOUNTER — Encounter: Payer: Self-pay | Admitting: Family Medicine

## 2016-02-22 ENCOUNTER — Ambulatory Visit (INDEPENDENT_AMBULATORY_CARE_PROVIDER_SITE_OTHER): Payer: BLUE CROSS/BLUE SHIELD | Admitting: Family Medicine

## 2016-02-22 VITALS — BP 110/80 | HR 73 | Temp 98.6°F | Resp 14 | Wt 153.7 lb

## 2016-02-22 DIAGNOSIS — R87610 Atypical squamous cells of undetermined significance on cytologic smear of cervix (ASC-US): Secondary | ICD-10-CM

## 2016-02-22 DIAGNOSIS — R634 Abnormal weight loss: Secondary | ICD-10-CM

## 2016-02-22 DIAGNOSIS — Z23 Encounter for immunization: Secondary | ICD-10-CM

## 2016-02-22 DIAGNOSIS — Z5181 Encounter for therapeutic drug level monitoring: Secondary | ICD-10-CM

## 2016-02-22 DIAGNOSIS — Z8543 Personal history of malignant neoplasm of ovary: Secondary | ICD-10-CM

## 2016-02-22 DIAGNOSIS — R1032 Left lower quadrant pain: Secondary | ICD-10-CM | POA: Diagnosis not present

## 2016-02-22 DIAGNOSIS — G43009 Migraine without aura, not intractable, without status migrainosus: Secondary | ICD-10-CM | POA: Diagnosis not present

## 2016-02-22 DIAGNOSIS — G8929 Other chronic pain: Secondary | ICD-10-CM | POA: Diagnosis not present

## 2016-02-22 DIAGNOSIS — I10 Essential (primary) hypertension: Secondary | ICD-10-CM

## 2016-02-22 DIAGNOSIS — E785 Hyperlipidemia, unspecified: Secondary | ICD-10-CM | POA: Diagnosis not present

## 2016-02-22 DIAGNOSIS — E119 Type 2 diabetes mellitus without complications: Secondary | ICD-10-CM | POA: Diagnosis not present

## 2016-02-22 LAB — CBC WITH DIFFERENTIAL/PLATELET
BASOS ABS: 0 {cells}/uL (ref 0–200)
BASOS PCT: 0 %
EOS ABS: 154 {cells}/uL (ref 15–500)
EOS PCT: 2 %
HCT: 42.1 % (ref 35.0–45.0)
HEMOGLOBIN: 13.9 g/dL (ref 11.7–15.5)
Lymphocytes Relative: 23 %
Lymphs Abs: 1771 cells/uL (ref 850–3900)
MCH: 27.7 pg (ref 27.0–33.0)
MCHC: 33 g/dL (ref 32.0–36.0)
MCV: 83.9 fL (ref 80.0–100.0)
MONO ABS: 693 {cells}/uL (ref 200–950)
MPV: 10.1 fL (ref 7.5–12.5)
Monocytes Relative: 9 %
Neutro Abs: 5082 cells/uL (ref 1500–7800)
Neutrophils Relative %: 66 %
Platelets: 295 10*3/uL (ref 140–400)
RBC: 5.02 MIL/uL (ref 3.80–5.10)
RDW: 14.2 % (ref 11.0–15.0)
WBC: 7.7 10*3/uL (ref 3.8–10.8)

## 2016-02-22 NOTE — Assessment & Plan Note (Signed)
Check TSH 

## 2016-02-22 NOTE — Assessment & Plan Note (Signed)
Check labs today, not truly fasting

## 2016-02-22 NOTE — Progress Notes (Signed)
BP 110/80   Pulse 73   Temp 98.6 F (37 C) (Oral)   Resp 14   Wt 153 lb 11.2 oz (69.7 kg)   SpO2 97%   BMI 28.11 kg/m    Subjective:    Patient ID: Jackie Fox, female    DOB: [birth year], 60 y.o.   MRN: [id number]  HPI: Jackie Fox is a 60 y.o. female  Chief Complaint  Patient presents with  . Medication Refill   Interpreter Chip Boer is here today; patient is new to me  She has had problem with LLQ pain; one month, comes and goes She had the same pain there, same pain and did complete hysterectomy When she goes to the bathroom, when having a BM; little pain; no blood in the stools Last colonoscopy more than 20 years ago Had a CT scan 11 years ago  Type 2 diabetes; used to have dry mouth but not now; no blurred vision; last eye exam was in July, glaucoma  High cholesterol; used to have cholesterol, but not on any medicines  Pain in the left heel; medial aspect  Migraines; every 2 months, uses triptan for relief; before the headaches, used to have aura, not any more  Stabbing pain in arms and legs; like a cramp just once in a while; good water drinker Flu shot today  Depression screen [facility] 2/9 02/22/2016 03/23/2015 12/22/2014  Decreased Interest 0 0 0  Down, Depressed, Hopeless 0 0 0  PHQ - 2 Score 0 0 0   Relevant past medical, surgical, family and social history reviewed Past Medical History:  Diagnosis Date  . Diabetes mellitus without complication ([place])   . Hypertension   . Migraine   . Ovarian cancer ([place]) 2005   Past Surgical History:  Procedure Laterality Date  . ABDOMINAL HYSTERECTOMY  2005  . BREAST BIOPSY Left 1990   Family History  Problem Relation Age of Onset  . Hyperlipidemia Mother   . Heart disease Mother   . Hypertension Mother   . Liver cancer Father   . Breast cancer Neg Hx    Social History  Substance Use Topics  . Smoking status: Never Smoker  . Smokeless tobacco: Never Used  . Alcohol use No    Interim medical history since  last visit reviewed. Allergies and medications reviewed  Review of Systems Per HPI unless specifically indicated above     Objective:    BP 110/80   Pulse 73   Temp 98.6 F (37 C) (Oral)   Resp 14   Wt 153 lb 11.2 oz (69.7 kg)   SpO2 97%   BMI 28.11 kg/m   Wt Readings from Last 3 Encounters:  02/22/16 153 lb 11.2 oz (69.7 kg)  03/23/15 164 lb 3.2 oz (74.5 kg)  12/22/14 161 lb (73 kg)    Physical Exam  Constitutional: She appears well-developed and well-nourished. No distress.  HENT:  Head: Normocephalic and atraumatic.  Eyes: EOM are normal. No scleral icterus.  Neck: No thyromegaly present.  Cardiovascular: Normal rate, regular rhythm and normal heart sounds.   No murmur heard. Pulmonary/Chest: Effort normal and breath sounds normal. No respiratory distress. She has no wheezes.  Abdominal: Soft. Bowel sounds are normal. She exhibits no distension. There is tenderness (LLQ).  Musculoskeletal: Normal range of motion. She exhibits no edema.  Neurological: She is alert. She exhibits normal muscle tone.  Skin: Skin is warm and dry. She is not diaphoretic. No pallor.  Psychiatric: She has a normal mood  and affect. Her behavior is normal. Judgment and thought content normal.   Diabetic Foot Form - Detailed   Diabetic Foot Exam - detailed Diabetic Foot exam was performed with the following findings:  Yes 02/22/2016  2:58 PM  Visual Foot Exam completed.:  Yes  Are the toenails ingrown?:  No Normal Range of Motion:  Yes Pulse Foot Exam completed.:  Yes  Right Dorsalis Pedis:  Present Left Dorsalis Pedis:  Present  Sensory Foot Exam Completed.:  Yes Semmes-Weinstein Monofilament Test R Site 1-Great Toe:  Pos L Site 1-Great Toe:  Pos  R Site 4:  Pos L Site 4:  Pos  R Site 5:  Pos L Site 5:  Pos          Assessment & Plan:   Problem List Items Addressed This Visit      Cardiovascular and Mediastinum   Migraine without aura and without status migrainosus, not  intractable (Chronic)    Not seeing aura; intermittent migraines, using triptan periodically; suggested magnesium      Hypertension goal BP (blood pressure) < 140/90 (Chronic)    Perfecto! Continue medicine        Endocrine   Controlled diabetes mellitus type II without complication (HCC) (Chronic)    Foot exam today; eye exam UTD; check A1c and urine microalbumin:Cr      Relevant Orders   Hemoglobin A1c (Completed)   Microalbumin / creatinine urine ratio (Completed)     Genitourinary   H/O ovarian cancer (Chronic)    Check CT scan      Relevant Orders   CA 125 (Completed)     Other   Medication monitoring encounter    Check labs      Relevant Orders   Magnesium (Completed)   Hyperlipidemia LDL goal <100 (Chronic)    Check labs today, not truly fasting      Relevant Orders   Lipid panel (Completed)   Chronic LLQ pain - Primary    Refer to GI for evaluation; order CT scan as well      Relevant Orders   CT Abdomen Pelvis W Contrast (Completed)   Ambulatory referral to Gastroenterology   CBC with Differential/Platelet (Completed)   COMPLETE METABOLIC PANEL WITH GFR (Completed)   Atypical squamous cells of undetermined significance (ASC-US) on cervical Pap smear    Managed by [facility], in notes prior to hysterectomy      Abnormal weight loss    Check TSH      Relevant Orders   TSH (Completed)    Other Visit Diagnoses    Needs flu shot       Relevant Orders   Flu Vaccine QUAD 36+ mos PF IM (Fluarix & Fluzone Quad PF) (Completed)      Follow up plan: Return in about 2 weeks (around 03/07/2016) for follow-up of labs and CT scan.  An after-visit summary was printed and given to the patient at [place].  Please see the patient instructions which may contain other information and recommendations beyond what is mentioned above in the assessment and plan.  Orders Placed This Encounter  Procedures  . CT Abdomen Pelvis W Contrast  . Flu Vaccine QUAD  36+ mos PF IM (Fluarix & Fluzone Quad PF)  . Magnesium  . Lipid panel  . CBC with Differential/Platelet  . COMPLETE METABOLIC PANEL WITH GFR  . TSH  . Hemoglobin A1c  . CA 125  . Microalbumin / creatinine urine ratio  . Ambulatory referral to Gastroenterology

## 2016-02-22 NOTE — Assessment & Plan Note (Signed)
Refer to GI for evaluation; order CT scan as well

## 2016-02-22 NOTE — Assessment & Plan Note (Signed)
Check labs 

## 2016-02-22 NOTE — Patient Instructions (Signed)
Please do see your eye doctor regularly, and have your eyes examined every year (or more often per his or her recommendation) Check your feet every night and let me know right away of any sores, infections, numbness, etc. Try to limit sweets, white bread, white rice, white potatoes It is okay with me for you to not check your fingerstick blood sugars (per [employer] of Endocrinology Best Practices), unless you are interested and feel it would be helpful for you Try to limit saturated fats in your diet (bologna, hot dogs, barbeque, cheeseburgers, hamburgers, steak, bacon, sausage, cheese, etc.) and get more fresh fruits, vegetables, and whole grains Return for follow-up soon to go over your labs and xray results

## 2016-02-22 NOTE — Assessment & Plan Note (Signed)
Foot exam today; eye exam UTD; check A1c and urine microalbumin:Cr

## 2016-02-22 NOTE — Assessment & Plan Note (Signed)
Not seeing aura; intermittent migraines, using triptan periodically; suggested magnesium

## 2016-02-22 NOTE — Assessment & Plan Note (Signed)
Perfecto! Continue medicine

## 2016-02-22 NOTE — Assessment & Plan Note (Signed)
Check CT scan

## 2016-02-23 LAB — COMPLETE METABOLIC PANEL WITH GFR
ALBUMIN: 4.1 g/dL (ref 3.6–5.1)
ALK PHOS: 58 U/L (ref 33–130)
ALT: 10 U/L (ref 6–29)
AST: 14 U/L (ref 10–35)
BILIRUBIN TOTAL: 0.6 mg/dL (ref 0.2–1.2)
BUN: 13 mg/dL (ref 7–25)
CO2: 30 mmol/L (ref 20–31)
CREATININE: 0.47 mg/dL — AB (ref 0.50–0.99)
Calcium: 9.4 mg/dL (ref 8.6–10.4)
Chloride: 102 mmol/L (ref 98–110)
GFR, Est African American: >89 mL/min (ref >=60)
GFR, Est Non African American: >89 mL/min (ref >=60)
GLUCOSE: 89 mg/dL (ref 65–99)
POTASSIUM: 4.2 mmol/L (ref 3.5–5.3)
SODIUM: 140 mmol/L (ref 135–146)
TOTAL PROTEIN: 6.9 g/dL (ref 6.1–8.1)

## 2016-02-23 LAB — MICROALBUMIN / CREATININE URINE RATIO
Creatinine, Urine: 42 mg/dL (ref 20–320)
MICROALB/CREAT RATIO: 5 ug/mg{creat} (ref <30)
Microalb, Ur: 0.2 mg/dL

## 2016-02-23 LAB — CA 125: CA 125: 20 U/mL (ref <35)

## 2016-02-23 LAB — HEMOGLOBIN A1C
Hgb A1c MFr Bld: 5.8 % — ABNORMAL HIGH (ref <5.7)
Mean Plasma Glucose: 120 mg/dL

## 2016-02-23 LAB — LIPID PANEL
CHOL/HDL RATIO: 4.8 ratio (ref <5.0)
CHOLESTEROL: 226 mg/dL — AB (ref <200)
HDL: 47 mg/dL — AB (ref >50)
LDL Cholesterol: 149 mg/dL — ABNORMAL HIGH (ref <100)
TRIGLYCERIDES: 150 mg/dL — AB (ref <150)
VLDL: 30 mg/dL (ref <30)

## 2016-02-23 LAB — TSH: TSH: 3.45 mIU/L

## 2016-02-23 LAB — MAGNESIUM: Magnesium: 2 mg/dL (ref 1.5–2.5)

## 2016-02-24 ENCOUNTER — Ambulatory Visit: Payer: BLUE CROSS/BLUE SHIELD

## 2016-02-29 ENCOUNTER — Ambulatory Visit
Admission: RE | Admit: 2016-02-29 | Discharge: 2016-02-29 | Disposition: A | Discharge status: Home / Self Care | Payer: BLUE CROSS/BLUE SHIELD | Source: Ambulatory Visit | Attending: Family Medicine | Admitting: Family Medicine

## 2016-02-29 DIAGNOSIS — R932 Abnormal findings on diagnostic imaging of liver and biliary tract: Secondary | ICD-10-CM | POA: Diagnosis not present

## 2016-02-29 DIAGNOSIS — G8929 Other chronic pain: Secondary | ICD-10-CM | POA: Diagnosis not present

## 2016-02-29 DIAGNOSIS — R1032 Left lower quadrant pain: Secondary | ICD-10-CM | POA: Insufficient documentation

## 2016-02-29 DIAGNOSIS — I700 Atherosclerosis of aorta: Secondary | ICD-10-CM | POA: Insufficient documentation

## 2016-02-29 DIAGNOSIS — N281 Cyst of kidney, acquired: Secondary | ICD-10-CM | POA: Insufficient documentation

## 2016-02-29 DIAGNOSIS — R918 Other nonspecific abnormal finding of lung field: Secondary | ICD-10-CM | POA: Insufficient documentation

## 2016-02-29 MED ORDER — IOPAMIDOL (ISOVUE-300) INJECTION 61%
100.0000 mL | Freq: Once | INTRAVENOUS | Status: AC | PRN
  Administered 2016-02-29: 100 mL via INTRAVENOUS

## 2016-03-03 NOTE — Assessment & Plan Note (Signed)
Managed by [facility], in notes prior to hysterectomy

## 2016-03-14 ENCOUNTER — Encounter: Payer: Self-pay | Admitting: Gastroenterology

## 2016-03-14 ENCOUNTER — Ambulatory Visit (INDEPENDENT_AMBULATORY_CARE_PROVIDER_SITE_OTHER): Payer: BLUE CROSS/BLUE SHIELD | Admitting: Gastroenterology

## 2016-03-14 VITALS — BP 139/72 | HR 62 | Temp 98.1°F | Ht 60.0 in | Wt 150.0 lb

## 2016-03-14 DIAGNOSIS — R194 Change in bowel habit: Secondary | ICD-10-CM

## 2016-03-14 DIAGNOSIS — R938 Abnormal findings on diagnostic imaging of other specified body structures: Secondary | ICD-10-CM

## 2016-03-14 DIAGNOSIS — R9389 Abnormal findings on diagnostic imaging of other specified body structures: Secondary | ICD-10-CM

## 2016-03-14 DIAGNOSIS — R1032 Left lower quadrant pain: Secondary | ICD-10-CM | POA: Diagnosis not present

## 2016-03-14 NOTE — Patient Instructions (Signed)
Please refer below for your future appointments.   Please head over to the medical mall, the registration desk to be directed to pick you you prep solution. They will tell you how to use it.   Please call us if you have any questions or concerns

## 2016-03-14 NOTE — Progress Notes (Signed)
Gastroenterology Consultation  Referring Provider:     Bobetta Lime, MD Primary Care Physician:  Bobetta Lime, MD Primary Gastroenterologist:  Dr. Jonathon Bellows  Reason for Consultation:     Abdominal pain        HPI:   Jackie Fox is a 60 y.o. y/o female referred for consultation & management  by Dr. Bobetta Lime, MD.  She is here with an interpretor as she speaks no [place]  She has been referred for LLQ pain .  Abdominal pain: Onset: 3 months back , comes and goes, occurs once a  Week , each episode occurs when she is about to urinate  And lasts for an hour Site :points to the LLQ Radiation: localized  Petra Kuba of pain: burning in nature Aggravating factors: no clear factos Relieving factors :ending her urination  Weight loss: lost weight intentionally  NSAID use: none  PPI use :none  Gall bladder surgery: none  Frequency of bowel movements: once a day everyday  Change in bowel movements: last 8 months caliber of stool appears thinners.  Relief with bowel movements: yes  Gas/Bloating/Abdominal distension: no    CT abdomen 02/29/16 did not show any gross abnormality , a small low attenuation structure in the Rt lobe was seen .Radiologist suggested a follow up CT scan . Labs 02/2016 - CBCD,BMP,TSH,LFT-normal.   Has a colonoscopy 20 years back.    Past Medical History:  Diagnosis Date  . Diabetes mellitus without complication ([place])   . Hypertension   . Migraine   . Ovarian cancer ([place]) 2005    Past Surgical History:  Procedure Laterality Date  . ABDOMINAL HYSTERECTOMY  2005  . BREAST BIOPSY Left 1990    Prior to Admission medications   Medication Sig Start Date End Date Taking? Authorizing Provider  glucose blood (BAYER CONTOUR TEST) test strip BAYER CONTOUR TEST (In Vitro Strip)  1 (one) Strip two times daily, as needed for 30 days  Quantity: 60;  Refills: 5   Ordered :30-Jun-2014  Bobetta Lime MD;  Started 30-Jun-2014 Active Comments:  Medication taken as needed.  06/30/14  Yes Historical Provider, MD  lisinopril-hydrochlorothiazide (PRINZIDE,ZESTORETIC) 10-12.5 MG per tablet Take 1 tablet by mouth daily. 12/22/14  Yes Bobetta Lime, MD  metFORMIN (GLUCOPHAGE) 500 MG tablet Take 1 tablet (500 mg total) by mouth daily. 02/03/16  Yes Arnetha Courser, MD  SUMAtriptan (IMITREX) 50 MG tablet Take 1 tablet (50 mg total) by mouth once. May repeat in 2 hours if headache persists or recurs. 12/22/14  Yes Bobetta Lime, MD  timolol (BETIMOL) 0.5 % ophthalmic solution 1 drop 2 (two) times daily.   Yes Historical Provider, MD    Family History  Problem Relation Age of Onset  . Hyperlipidemia Mother   . Heart disease Mother   . Hypertension Mother   . Liver cancer Father   . Breast cancer Neg Hx      Social History  Substance Use Topics  . Smoking status: Never Smoker  . Smokeless tobacco: Never Used  . Alcohol use No    Allergies as of 03/14/2016 - Review Complete 03/14/2016  Allergen Reaction Noted  . Ibuprofen Swelling 12/22/2014  . Shrimp [shellfish allergy]  12/22/2014    Review of Systems:    All systems reviewed and negative except where noted in HPI.   Physical Exam:  BP 139/72   Pulse 62   Temp 98.1 F (36.7 C) (Oral)   Ht 5' (1.524 m)   Wt 150 lb (  68 kg)   BMI 29.29 kg/m  No LMP recorded. Patient has had a hysterectomy. Psych:  Alert and cooperative. Normal mood and affect. General:   Alert,  Well-developed, well-nourished, pleasant and cooperative in NAD Head:  Normocephalic and atraumatic. Eyes:  Sclera clear, no icterus.   Conjunctiva pink. Ears:  Normal auditory acuity. Nose:  No deformity, discharge, or lesions. Mouth:  No deformity or lesions,oropharynx pink & moist. Neck:  Supple; no masses or thyromegaly. Lungs:  Respirations even and unlabored.  Clear throughout to auscultation.   No wheezes, crackles, or rhonchi. No acute distress. Heart:  Regular rate and rhythm; no murmurs, clicks, rubs,  or gallops. Abdomen:  Normal bowel sounds.  No bruits.  Soft, non-tender and non-distended without masses, hepatosplenomegaly or hernias noted.  No guarding or rebound tenderness.    Psych:  Alert and cooperative. Normal mood and affect.  Imaging Studies: Ct Abdomen Pelvis W Contrast  Result Date: 02/29/2016 CLINICAL DATA:  History of ovarian carcinoma, now presenting with left lower quadrant pain for 1 month EXAM: CT ABDOMEN AND PELVIS WITH CONTRAST TECHNIQUE: Multidetector CT imaging of the abdomen and pelvis was performed using the standard protocol following bolus administration of intravenous contrast. CONTRAST:  161mL ISOVUE-300 IOPAMIDOL (ISOVUE-300) INJECTION 61% COMPARISON:  CT abdomen pelvis of 03/25/2007 and CT abdomen pelvis of 12/19/2005 FINDINGS: Lower chest: A small 5 mm pleural-based nodule in the posterior medial right lower lobe on image #2 was present on the CT from 2007 and is therefore consistent with a benign process. Small noncalcified nodules in the left lower lobe on the image 2 and 4 appear stable compared to the prior CT Hepatobiliary: A tiny low-attenuation structure in the posterior right lobe of liver near the dome is not definitely seen previously but is most consistent with a benign process. No additional hepatic lesion is seen. This area so close to the hemidiaphragm that ultrasound may not be helpful in assessment. No calcified gallstones are seen. Pancreas: The pancreas is normal in size and the pancreatic duct is not dilated. Spleen: The spleen is unremarkable. Adrenals/Urinary Tract: The adrenal glands appear normal. The kidneys enhance with no calculus or solid mass. There is a cyst emanating from the posterior right kidney measuring 1.9 cm with attenuation of 24 HU. This appears represent and enlarging cyst when compared to the prior CT. Ultrasound sound could be helpful in assessment of this region. On delayed images, the pelvocaliceal systems are unremarkable. The  ureters are normal in caliber. No ureteral calculus is seen. Stomach/Bowel: The stomach is largely decompressed. No small bowel distention is seen. There is feces throughout the colon. No colonic lesion is seen. The terminal ileum is unremarkable. The appendix is not visualized. Vascular/Lymphatic: There is moderate abdominal aortic atherosclerosis present. No aneurysmal dilatation is seen. No adenopathy is noted. Reproductive: The uterus has previously been resected. No adnexal lesion is seen. No fluid is noted within the pelvis. Other: Small amount of fat does enter the right inguinal canal. Musculoskeletal: There is degenerative change of the facet joints of the lower lumbar spine. Probable old compression deformities of T11 and T12 are noted. Clinical correlation is recommended. IMPRESSION: 1. No definite explanation for the patient's left lower quadrant pain is seen. No mass or adenopathy is noted. 2. Stable small noncalcified nodules in the lung bases. 3. Small low-attenuation structure in the posterior right lobe of liver most consistent with benign process but not definitely seen previously. Ultrasound may not be helpful in assessment of this  area and therefore followup CT may be warranted if clinically indicated. 4. Enlarging cyst in the posterior right kidney now measuring 1.9 cm. 5. Moderate abdominal aortic atherosclerosis. 6. Probable old partial compression deformities of T11 and T12. Electronically Signed   By: Ivar Drape M.D.   On: 02/29/2016 11:25    Assessment and Plan:   Jackie Fox is a 60 y.o. y/o female has been referred for LLQ abdominal pain.The pattern of pain does not clearly fit any GI disorder. She mentions relief of pain after urination . She does mention change in caliber of her stool recently and has not had a colonoscopy in 20 years If colonoscopy is normal , she may benefit from an urology consultation . She does mention that she has had a recent pelvic exam.   I also  noted an abnormal lesion on her CT scan in her liver. I will arrange for a CT scan of her abdomen in 2 months to ensure the lesion is stable.    Follow up in 8-12 weeks .     Dr Jonathon Bellows MD

## 2016-03-28 ENCOUNTER — Other Ambulatory Visit: Payer: Self-pay

## 2016-03-28 DIAGNOSIS — I10 Essential (primary) hypertension: Secondary | ICD-10-CM

## 2016-03-28 MED ORDER — LISINOPRIL-HYDROCHLOROTHIAZIDE 10-12.5 MG PO TABS: 1.0000 | ORAL_TABLET | Freq: Every day | ORAL | 1 refills | Status: DC

## 2016-03-28 NOTE — Telephone Encounter (Signed)
Last cr and K+ reviewed; Rx approved 

## 2016-04-04 ENCOUNTER — Ambulatory Visit (INDEPENDENT_AMBULATORY_CARE_PROVIDER_SITE_OTHER): Payer: BLUE CROSS/BLUE SHIELD | Admitting: Family Medicine

## 2016-04-04 ENCOUNTER — Encounter: Payer: Self-pay | Admitting: Family Medicine

## 2016-04-04 DIAGNOSIS — G8929 Other chronic pain: Secondary | ICD-10-CM | POA: Diagnosis not present

## 2016-04-04 DIAGNOSIS — E785 Hyperlipidemia, unspecified: Secondary | ICD-10-CM | POA: Diagnosis not present

## 2016-04-04 DIAGNOSIS — N281 Cyst of kidney, acquired: Secondary | ICD-10-CM

## 2016-04-04 DIAGNOSIS — I700 Atherosclerosis of aorta: Secondary | ICD-10-CM | POA: Diagnosis not present

## 2016-04-04 DIAGNOSIS — M25511 Pain in right shoulder: Secondary | ICD-10-CM | POA: Diagnosis not present

## 2016-04-04 NOTE — Assessment & Plan Note (Signed)
Discussed with patient through interpreter; will order renal US

## 2016-04-04 NOTE — Progress Notes (Signed)
BP 130/78 (BP Location: Left Arm, Patient Position: Sitting, Cuff Size: Large)   Pulse 72   Temp 98 F (36.7 C) (Oral)   Resp 14   Ht 5' (1.524 m)   Wt 152 lb 3.2 oz (69 kg)   SpO2 97%   BMI 29.72 kg/m    Subjective:    Patient ID: Jackie Fox, female    DOB: 04-Nov-1955, 60 y.o.   MRN: [id number]  HPI: Jackie Fox is a 60 y.o. female  Chief Complaint  Patient presents with  . Discuss Labs  . Shoulder Pain   Patient is here with spanish-speaking interpreter; here to go over lab results as well; all labs and xrays reviewed with her and husband with interpreter providing explanation, and copies given to her for review later  She has pain in the right shoulder; lasts about 3 days When she does a lot of movement, makes it worse Right-handed She takes tylenol and that decreases No old injuries makes tortilla and works the dough, flour tortillas No numbness or weakness in the arm  She went to see the GI doctor They are going to look at her liver  No family hx of kidney problems Hx of cyst noted on previous scan, enlarged cyst  Abd/pelvic CT scan Feb 29, 2016 impression copied and pasted: IMPRESSION: 1. No definite explanation for the patient's left lower quadrant pain is seen. No mass or adenopathy is noted. 2. Stable small noncalcified nodules in the lung bases. 3. Small low-attenuation structure in the posterior right lobe of liver most consistent with benign process but not definitely seen previously. Ultrasound may not be helpful in assessment of this area and therefore followup CT may be warranted if clinically indicated. 4. Enlarging cyst in the posterior right kidney now measuring 1.9 cm. 5. Moderate abdominal aortic atherosclerosis. 6. Probable old partial compression deformities of T11 and T12.   Electronically Signed   By: Ivar Drape M.D.   On: 02/29/2016 11:25  Depression screen [facility] 2/9 04/04/2016 02/22/2016 03/23/2015 12/22/2014  Decreased  Interest 0 0 0 0  Down, Depressed, Hopeless 0 0 0 0  PHQ - 2 Score 0 0 0 0   Relevant past medical, surgical, family and social history reviewed Past Medical History:  Diagnosis Date  . Diabetes mellitus without complication ([place])   . Hyperlipidemia LDL goal <70 12/22/2014  . Hypertension   . Migraine   . Ovarian cancer ([place]) 2005   Past Surgical History:  Procedure Laterality Date  . ABDOMINAL HYSTERECTOMY  2005  . BREAST BIOPSY Left 1990   Family History  Problem Relation Age of Onset  . Hyperlipidemia Mother   . Heart disease Mother   . Hypertension Mother   . Liver cancer Father   . Breast cancer Neg Hx    Social History  Substance Use Topics  . Smoking status: Never Smoker  . Smokeless tobacco: Never Used  . Alcohol use No   Interim medical history since last visit reviewed. Allergies and medications reviewed  Review of Systems Per HPI unless specifically indicated above     Objective:    BP 130/78 (BP Location: Left Arm, Patient Position: Sitting, Cuff Size: Large)   Pulse 72   Temp 98 F (36.7 C) (Oral)   Resp 14   Ht 5' (1.524 m)   Wt 152 lb 3.2 oz (69 kg)   SpO2 97%   BMI 29.72 kg/m   Wt Readings from Last 3 Encounters:  04/17/16 152 lb (  68.9 kg)  04/04/16 152 lb 3.2 oz (69 kg)  03/14/16 150 lb (68 kg)    Physical Exam  Constitutional: She appears well-developed and well-nourished.  HENT:  Mouth/Throat: Mucous membranes are normal.  Eyes: EOM are normal. No scleral icterus.  Cardiovascular: Normal rate and regular rhythm.   Pulmonary/Chest: Effort normal and breath sounds normal.  Musculoskeletal:       Right shoulder: She exhibits decreased range of motion and tenderness. She exhibits no swelling, no effusion, no crepitus and normal strength.  Psychiatric: She has a normal mood and affect. Her behavior is normal.   Results for orders placed or performed in visit on 02/22/16  Magnesium  Result Value Ref Range   Magnesium 2.0 1.5 - 2.5  mg/dL  Lipid panel  Result Value Ref Range   Cholesterol 226 (H) <200 mg/dL   Triglycerides 150 (H) <150 mg/dL   HDL 47 (L) >50 mg/dL   Total CHOL/HDL Ratio 4.8 <5.0 Ratio   VLDL 30 <30 mg/dL   LDL Cholesterol 149 (H) <100 mg/dL  CBC with Differential/Platelet  Result Value Ref Range   WBC 7.7 3.8 - 10.8 K/uL   RBC 5.02 3.80 - 5.10 MIL/uL   Hemoglobin 13.9 11.7 - 15.5 g/dL   HCT 42.1 35.0 - 45.0 %   MCV 83.9 80.0 - 100.0 fL   MCH 27.7 27.0 - 33.0 pg   MCHC 33.0 32.0 - 36.0 g/dL   RDW 14.2 11.0 - 15.0 %   Platelets 295 140 - 400 K/uL   MPV 10.1 7.5 - 12.5 fL   Neutro Abs 5,082 1,500 - 7,800 cells/uL   Lymphs Abs 1,771 850 - 3,900 cells/uL   Monocytes Absolute 693 200 - 950 cells/uL   Eosinophils Absolute 154 15 - 500 cells/uL   Basophils Absolute 0 0 - 200 cells/uL   Neutrophils Relative % 66 %   Lymphocytes Relative 23 %   Monocytes Relative 9 %   Eosinophils Relative 2 %   Basophils Relative 0 %   Smear Review Criteria for review not met   COMPLETE METABOLIC PANEL WITH GFR  Result Value Ref Range   Sodium 140 135 - 146 mmol/L   Potassium 4.2 3.5 - 5.3 mmol/L   Chloride 102 98 - 110 mmol/L   CO2 30 20 - 31 mmol/L   Glucose, Bld 89 65 - 99 mg/dL   BUN 13 7 - 25 mg/dL   Creat 0.47 (L) 0.50 - 0.99 mg/dL   Total Bilirubin 0.6 0.2 - 1.2 mg/dL   Alkaline Phosphatase 58 33 - 130 U/L   AST 14 10 - 35 U/L   ALT 10 6 - 29 U/L   Total Protein 6.9 6.1 - 8.1 g/dL   Albumin 4.1 3.6 - 5.1 g/dL   Calcium 9.4 8.6 - 10.4 mg/dL   GFR, Est African American >89 >=60 mL/min   GFR, Est Non African American >89 >=60 mL/min  TSH  Result Value Ref Range   TSH 3.45 mIU/L  Hemoglobin A1c  Result Value Ref Range   Hgb A1c MFr Bld 5.8 (H) <5.7 %   Mean Plasma Glucose 120 mg/dL  CA 125  Result Value Ref Range   CA 125 20 <35 U/mL  Microalbumin / creatinine urine ratio  Result Value Ref Range   Creatinine, Urine 42 20 - 320 mg/dL   Microalb, Ur 0.2 Not estab mg/dL   Microalb Creat  Ratio 5 <30 mcg/mg creat      Assessment &  Plan:   Problem List Items Addressed This Visit      Cardiovascular and Mediastinum   Abdominal aortic atherosclerosis ([place])    Discussed with patient, cross-section model of atheromatous vessel used to explain process; important to eat right, maintain healthy weight, get adequate exercise, and try to get LDL down under 70; limit saturated fats; start aspirin      Relevant Medications   aspirin EC 81 MG tablet     Genitourinary   Cyst of right kidney    Discussed with patient through interpreter; will order renal US      Relevant Orders   US Renal (Completed)     Other   Shoulder pain, right    Shoulder xray      Relevant Orders   DG Shoulder Right   Hyperlipidemia LDL goal <70    Patient does not want a statin; she wants to try diet first; if we can get LDL under 70, we can achieve plaque regression; recheck lipids in a few months to see what effect she can achieve with TLC alone      Relevant Medications   aspirin EC 81 MG tablet      Follow up plan: Return in about 5 months (around 08/22/2016) for fasting labs only;; visit with Dr. Sanda Klein the next week; 40 minutes with interpreter.  An after-visit summary was printed and given to the patient at [place].  Please see the patient instructions which may contain other information and recommendations beyond what is mentioned above in the assessment and plan.  Meds ordered this encounter  Medications  . aspirin EC 81 MG tablet    Sig: Take 1 tablet (81 mg total) by mouth daily.    Orders Placed This Encounter  Procedures  . DG Shoulder Right  . US Renal   Extra time was needed because of interpreter services, reviewing labs in detail, as well as reviewing prior CT scan in detail with her; time allowed for questions Face-to-face time with patient was more than 40 minutes, >50% time spent counseling and coordination of care

## 2016-04-04 NOTE — Assessment & Plan Note (Signed)
Shoulder xray

## 2016-04-04 NOTE — Patient Instructions (Addendum)
Please have xrays done across the street at your convenience  Try turmeric as a natural anti-inflammatory (for pain and arthritis). It comes in capsules where you buy aspirin and fish oil, but also as a spice where you buy pepper and garlic powder.  The maximum amount of tylenol recommended per day, so do not take more than that per 24 hour period  Start taking a coated aspirin 81 mg daily  Try to limit saturated fats in your diet (bologna, hot dogs, barbeque, cheeseburgers, hamburgers, steak, bacon, sausage, cheese, etc.) and get more fresh fruits, vegetables, and whole grains  Return on or just after Aug 22, 2016 for your next fasting labs, then come in to see me the next week with the interpreter for a visit and we can discuss your labs then  Please call before then with any problems

## 2016-04-11 ENCOUNTER — Ambulatory Visit
Admission: RE | Admit: 2016-04-11 | Discharge: 2016-04-11 | Disposition: A | Discharge status: Home / Self Care | Payer: BLUE CROSS/BLUE SHIELD | Source: Ambulatory Visit | Attending: Family Medicine | Admitting: Family Medicine

## 2016-04-11 DIAGNOSIS — N281 Cyst of kidney, acquired: Secondary | ICD-10-CM | POA: Insufficient documentation

## 2016-04-14 ENCOUNTER — Encounter: Payer: Self-pay | Admitting: *Deleted

## 2016-04-17 ENCOUNTER — Encounter
Admission: RE | Disposition: A | Discharge status: Home / Self Care | Payer: Self-pay | Source: Ambulatory Visit | Attending: Gastroenterology

## 2016-04-17 ENCOUNTER — Ambulatory Visit: Payer: BLUE CROSS/BLUE SHIELD | Admitting: Certified Registered"

## 2016-04-17 ENCOUNTER — Encounter: Payer: Self-pay | Admitting: *Deleted

## 2016-04-17 ENCOUNTER — Ambulatory Visit
Admission: RE | Admit: 2016-04-17 | Discharge: 2016-04-17 | Disposition: A | Discharge status: Home / Self Care | Payer: BLUE CROSS/BLUE SHIELD | Source: Ambulatory Visit | Attending: Gastroenterology | Admitting: Gastroenterology

## 2016-04-17 DIAGNOSIS — R51 Headache: Secondary | ICD-10-CM | POA: Diagnosis not present

## 2016-04-17 DIAGNOSIS — Z79899 Other long term (current) drug therapy: Secondary | ICD-10-CM | POA: Diagnosis not present

## 2016-04-17 DIAGNOSIS — E119 Type 2 diabetes mellitus without complications: Secondary | ICD-10-CM | POA: Diagnosis not present

## 2016-04-17 DIAGNOSIS — Z7982 Long term (current) use of aspirin: Secondary | ICD-10-CM | POA: Insufficient documentation

## 2016-04-17 DIAGNOSIS — M1990 Unspecified osteoarthritis, unspecified site: Secondary | ICD-10-CM | POA: Insufficient documentation

## 2016-04-17 DIAGNOSIS — D122 Benign neoplasm of ascending colon: Secondary | ICD-10-CM | POA: Insufficient documentation

## 2016-04-17 DIAGNOSIS — I10 Essential (primary) hypertension: Secondary | ICD-10-CM | POA: Insufficient documentation

## 2016-04-17 DIAGNOSIS — R194 Change in bowel habit: Secondary | ICD-10-CM

## 2016-04-17 DIAGNOSIS — Z7984 Long term (current) use of oral hypoglycemic drugs: Secondary | ICD-10-CM | POA: Diagnosis not present

## 2016-04-17 DIAGNOSIS — R195 Other fecal abnormalities: Secondary | ICD-10-CM

## 2016-04-17 DIAGNOSIS — Z8543 Personal history of malignant neoplasm of ovary: Secondary | ICD-10-CM | POA: Insufficient documentation

## 2016-04-17 HISTORY — PX: COLONOSCOPY WITH PROPOFOL: SHX5780

## 2016-04-17 LAB — GLUCOSE, CAPILLARY: GLUCOSE-CAPILLARY: 79 mg/dL (ref 65–99)

## 2016-04-17 SURGERY — COLONOSCOPY WITH PROPOFOL
Anesthesia: General

## 2016-04-17 MED ORDER — LIDOCAINE 2% (20 MG/ML) 5 ML SYRINGE
INTRAMUSCULAR | Status: AC
  Filled 2016-04-17: qty 5

## 2016-04-17 MED ORDER — LIDOCAINE 2% (20 MG/ML) 5 ML SYRINGE
INTRAMUSCULAR | Status: DC | PRN
  Administered 2016-04-17: 50 mg via INTRAVENOUS

## 2016-04-17 MED ORDER — EPHEDRINE SULFATE 50 MG/ML IJ SOLN
INTRAMUSCULAR | Status: DC | PRN
  Administered 2016-04-17 (×2): 10 mg via INTRAVENOUS

## 2016-04-17 MED ORDER — PROPOFOL 500 MG/50ML IV EMUL
INTRAVENOUS | Status: DC | PRN
  Administered 2016-04-17: 120 ug/kg/min via INTRAVENOUS

## 2016-04-17 MED ORDER — PROPOFOL 500 MG/50ML IV EMUL
INTRAVENOUS | Status: AC
  Filled 2016-04-17: qty 50

## 2016-04-17 MED ORDER — PROPOFOL 10 MG/ML IV BOLUS
INTRAVENOUS | Status: DC | PRN
  Administered 2016-04-17: 30 mg via INTRAVENOUS
  Administered 2016-04-17: 70 mg via INTRAVENOUS

## 2016-04-17 MED ORDER — SODIUM CHLORIDE 0.9 % IV SOLN
INTRAVENOUS | Status: DC
  Administered 2016-04-17: 09:00:00 via INTRAVENOUS

## 2016-04-17 MED ORDER — SODIUM CHLORIDE 0.9 % IV SOLN: INTRAVENOUS | Status: DC

## 2016-04-17 MED ORDER — EPHEDRINE 5 MG/ML INJ
INTRAVENOUS | Status: AC
  Filled 2016-04-17: qty 10

## 2016-04-17 NOTE — Op Note (Signed)
[facility] Patient Name: Jackie Fox Procedure Date: 04/17/2016 9:18 AM MRN: [id number] Account #: [id number] Date of Birth: 10-May-1955 Admit Type: Outpatient Age: 61 Room: [facility] ENDO ROOM 4 Gender: Female Note Status: Finalized Procedure:            Colonoscopy Indications:          Change in bowel habits Providers:            Jonathon Bellows MD, MD Referring MD:         Arnetha Courser (Referring MD) Medicines:            Monitored Anesthesia Care Complications:        No immediate complications. Procedure:            Pre-Anesthesia Assessment:                       - Prior to the procedure, a History and Physical was                        performed, and patient medications, allergies and                        sensitivities were reviewed. The patient's tolerance of                        previous anesthesia was reviewed.                       - The risks and benefits of the procedure and the                        sedation options and risks were discussed with the                        patient. All questions were answered and informed                        consent was obtained.                       - The risks and benefits of the procedure and the                        sedation options and risks were discussed with the                        patient. All questions were answered and informed                        consent was obtained.                       - ASA Grade Assessment: II - A patient with mild                        systemic disease.                       After obtaining informed consent, the colonoscope was  passed under direct vision. Throughout the procedure,                        the patient's blood pressure, pulse, and oxygen                        saturations were monitored continuously. The                        Colonoscope was introduced through the anus and                        advanced to the  the cecum, identified by the                        appendiceal orifice, IC valve and transillumination.                        The colonoscopy was performed with ease. The patient                        tolerated the procedure well. The quality of the bowel                        preparation was excellent. Findings:      The perianal and digital rectal examinations were normal.      A 3 mm polyp was found in the ascending colon. The polyp was sessile.       The polyp was removed with a cold biopsy forceps. Resection and       retrieval were complete.      The exam was otherwise without abnormality on direct and retroflexion       views. Impression:           - One 3 mm polyp in the ascending colon, removed with a                        cold biopsy forceps. Resected and retrieved.                       - The examination was otherwise normal on direct and                        retroflexion views. Recommendation:       - Patient has a contact number available for                        emergencies. The signs and symptoms of potential                        delayed complications were discussed with the patient.                        Return to normal activities tomorrow. Written discharge                        instructions were provided to the patient.                       - Resume previous diet.                       -  Continue present medications.                       - Await pathology results.                       - Repeat colonoscopy in 5-10 years for surveillance                        based on pathology results.                       - Return to GI clinic as previously scheduled. Procedure Code(s):    --- Professional ---                       [phone], Colonoscopy, flexible; with biopsy, single or                        multiple Diagnosis Code(s):    --- Professional ---                       D12.2, Benign neoplasm of ascending colon                       R19.4, Change in bowel  habit CPT copyright 2016 American Medical Association. All rights reserved. The codes documented in this report are preliminary and upon coder review may  be revised to meet current compliance requirements. Jonathon Bellows, MD Jonathon Bellows MD, MD 04/17/2016 9:45:32 AM This report has been signed electronically. Number of Addenda: 0 Note Initiated On: 04/17/2016 9:18 AM Scope Withdrawal Time: 0 hours 12 minutes 46 seconds  Total Procedure Duration: 0 hours 20 minutes 1 second       [facility]

## 2016-04-17 NOTE — H&P (Signed)
[address] [place], [place] [address] Phone: [phone] Fax : [phone]  Primary Care Physician:  Bobetta Lime, MD Primary Gastroenterologist:  Dr. Jonathon Bellows   Pre-Procedure History & Physical: HPI:  Jackie Fox is a 61 y.o. female is here for an colonoscopy.   Past Medical History:  Diagnosis Date  . Diabetes mellitus without complication ([place])   . Hypertension   . Migraine   . Ovarian cancer ([place]) 2005    Past Surgical History:  Procedure Laterality Date  . ABDOMINAL HYSTERECTOMY  2005  . BREAST BIOPSY Left 1990    Prior to Admission medications   Medication Sig Start Date End Date Taking? Authorizing Provider  aspirin EC 81 MG tablet Take 1 tablet (81 mg total) by mouth daily. 04/04/16  Yes Arnetha Courser, MD  lisinopril-hydrochlorothiazide (PRINZIDE,ZESTORETIC) 10-12.5 MG tablet Take 1 tablet by mouth daily. 03/28/16  Yes Arnetha Courser, MD  metFORMIN (GLUCOPHAGE) 500 MG tablet Take 1 tablet (500 mg total) by mouth daily. 02/03/16  Yes Arnetha Courser, MD  SUMAtriptan (IMITREX) 50 MG tablet Take 1 tablet (50 mg total) by mouth once. May repeat in 2 hours if headache persists or recurs. 12/22/14  Yes Bobetta Lime, MD  timolol (BETIMOL) 0.5 % ophthalmic solution 1 drop 2 (two) times daily.   Yes Historical Provider, MD  glucose blood (BAYER CONTOUR TEST) test strip BAYER CONTOUR TEST (In Vitro Strip)  1 (one) Strip two times daily, as needed for 30 days  Quantity: 60;  Refills: 5   Ordered :30-Jun-2014  Bobetta Lime MD;  Started 30-Jun-2014 Active Comments: Medication taken as needed.  06/30/14   Historical Provider, MD    Allergies as of 03/14/2016 - Review Complete 03/14/2016  Allergen Reaction Noted  . Ibuprofen Swelling 12/22/2014  . Shrimp [shellfish allergy]  12/22/2014    Family History  Problem Relation Age of Onset  . Hyperlipidemia Mother   . Heart disease Mother   . Hypertension Mother   . Liver cancer Father    . Breast cancer Neg Hx     Social History   Social History  . Marital status: Married    Spouse name: N/A  . Number of children: N/A  . Years of education: N/A   Occupational History  . Not on file.   Social History Main Topics  . Smoking status: Never Smoker  . Smokeless tobacco: Never Used  . Alcohol use No  . Drug use: No  . Sexual activity: Yes    Partners: Male   Other Topics Concern  . Not on file   Social History Narrative  . No narrative on file    Review of Systems: See HPI, otherwise negative ROS  Physical Exam: BP 129/74   Pulse 72   Temp 97.8 F (36.6 C) (Tympanic)   Resp 16   Ht 5' (1.524 m)   Wt 152 lb (68.9 kg)   SpO2 100%   BMI 29.69 kg/m  General:   Alert,  pleasant and cooperative in NAD Head:  Normocephalic and atraumatic. Neck:  Supple; no masses or thyromegaly. Lungs:  Clear throughout to auscultation.    Heart:  Regular rate and rhythm. Abdomen:  Soft, nontender and nondistended. Normal bowel sounds, without guarding, and without rebound.   Neurologic:  Alert and  oriented x4;  grossly normal neurologically.  Impression/Plan: Jackie Fox is here for an colonoscopy to be performed for change in shape of stool   Risks, benefits, limitations,  and alternatives regarding  colonoscopy have been reviewed with the patient.  Questions have been answered.  All parties agreeable.   Jonathon Bellows, MD  04/17/2016, 9:19 AM

## 2016-04-17 NOTE — Transfer of Care (Signed)
Immediate Anesthesia Transfer of Care Note  Patient: Antwan Rucci  Procedure(s) Performed: Procedure(s): COLONOSCOPY WITH PROPOFOL (N/A)  Patient Location: Endoscopy Unit  Anesthesia Type:General  Level of Consciousness: awake and alert   Airway & Oxygen Therapy: Patient Spontanous Breathing and Patient connected to nasal cannula oxygen  Post-op Assessment: Report given to RN and Post -op Vital signs reviewed and stable  Post vital signs: Reviewed  Last Vitals:  Vitals:   04/17/16 0848 04/17/16 0947  BP: 129/74 105/60  Pulse: 72 77  Resp: 16 16  Temp: 36.6 C 36.6 C    Last Pain:  Vitals:   04/17/16 0848  TempSrc: Tympanic         Complications: No apparent anesthesia complications

## 2016-04-17 NOTE — Anesthesia Preprocedure Evaluation (Signed)
Anesthesia Evaluation  Patient identified by MRN, date of birth, ID band Patient awake    Reviewed: Allergy & Precautions, NPO status , Patient's Chart, lab work & pertinent test results  History of Anesthesia Complications Negative for: history of anesthetic complications  Airway Mallampati: II  TM Distance: >3 FB Neck ROM: Full    Dental  (+) Implants   Pulmonary neg pulmonary ROS, neg sleep apnea, neg COPD,    breath sounds clear to auscultation- rhonchi (-) wheezing      Cardiovascular Exercise Tolerance: Good hypertension, Pt. on medications (-) CAD and (-) Past MI  Rhythm:Regular Rate:Normal - Systolic murmurs and - Diastolic murmurs    Neuro/Psych  Headaches, negative psych ROS   GI/Hepatic negative GI ROS, Neg liver ROS,   Endo/Other  diabetes, Type 2, Oral Hypoglycemic Agents  Renal/GU Renal disease: renal cyst.     Musculoskeletal  (+) Arthritis ,   Abdominal (+) - obese,   Peds  Hematology negative hematology ROS (+)   Anesthesia Other Findings Past Medical History: No date: Diabetes mellitus without complication ([facility]) No date: Hypertension No date: Migraine 2005: Ovarian cancer ([place])   Reproductive/Obstetrics                             Anesthesia Physical Anesthesia Plan  ASA: II  Anesthesia Plan: General   Post-op Pain Management:    Induction: Intravenous  Airway Management Planned: Natural Airway  Additional Equipment:   Intra-op Plan:   Post-operative Plan:   Informed Consent: I have reviewed the patients History and Physical, chart, labs and discussed the procedure including the risks, benefits and alternatives for the proposed anesthesia with the patient or authorized representative who has indicated his/her understanding and acceptance.   Dental advisory given  Plan Discussed with: CRNA and Anesthesiologist  Anesthesia Plan Comments:          Anesthesia Quick Evaluation

## 2016-04-17 NOTE — Anesthesia Postprocedure Evaluation (Signed)
Anesthesia Post Note  Patient: Jackie Fox  Procedure(s) Performed: Procedure(s) (LRB): COLONOSCOPY WITH PROPOFOL (N/A)  Patient location during evaluation: Endoscopy Anesthesia Type: General Level of consciousness: awake and alert and oriented Pain management: pain level controlled Vital Signs Assessment: post-procedure vital signs reviewed and stable Respiratory status: spontaneous breathing, nonlabored ventilation and respiratory function stable Cardiovascular status: blood pressure returned to baseline and stable Postop Assessment: no signs of nausea or vomiting Anesthetic complications: no     Last Vitals:  Vitals:   04/17/16 0959 04/17/16 1009  BP: 113/62 118/60  Pulse: 71 69  Resp: 14 15  Temp:      Last Pain:  Vitals:   04/17/16 0949  TempSrc: Tympanic                 Brenda Samano

## 2016-04-18 ENCOUNTER — Encounter: Payer: Self-pay | Admitting: Family Medicine

## 2016-04-18 DIAGNOSIS — I700 Atherosclerosis of aorta: Secondary | ICD-10-CM | POA: Insufficient documentation

## 2016-04-18 LAB — SURGICAL PATHOLOGY

## 2016-04-18 NOTE — Assessment & Plan Note (Signed)
Patient does not want a statin; she wants to try diet first; if we can get LDL under 70, we can achieve plaque regression; recheck lipids in a few months to see what effect she can achieve with TLC alone

## 2016-04-18 NOTE — Assessment & Plan Note (Addendum)
Discussed with patient, cross-section model of atheromatous vessel used to explain process; important to eat right, maintain healthy weight, get adequate exercise, and try to get LDL down under 70; limit saturated fats; start aspirin

## 2016-04-24 ENCOUNTER — Encounter: Payer: Self-pay | Admitting: Gastroenterology

## 2016-04-24 ENCOUNTER — Ambulatory Visit: Admission: RE | Admit: 2016-04-24 | Payer: BLUE CROSS/BLUE SHIELD | Source: Ambulatory Visit

## 2016-04-25 ENCOUNTER — Ambulatory Visit: Payer: BLUE CROSS/BLUE SHIELD | Admitting: Gastroenterology

## 2016-04-25 ENCOUNTER — Ambulatory Visit: Admission: RE | Admit: 2016-04-25 | Payer: BLUE CROSS/BLUE SHIELD | Source: Ambulatory Visit

## 2016-04-25 ENCOUNTER — Ambulatory Visit
Admission: RE | Admit: 2016-04-25 | Discharge: 2016-04-25 | Disposition: A | Discharge status: Home / Self Care | Payer: BLUE CROSS/BLUE SHIELD | Source: Ambulatory Visit | Attending: Gastroenterology | Admitting: Gastroenterology

## 2016-04-25 ENCOUNTER — Ambulatory Visit: Admit: 2016-04-25 | Payer: BLUE CROSS/BLUE SHIELD

## 2016-04-25 DIAGNOSIS — R938 Abnormal findings on diagnostic imaging of other specified body structures: Secondary | ICD-10-CM | POA: Diagnosis present

## 2016-04-25 DIAGNOSIS — R932 Abnormal findings on diagnostic imaging of liver and biliary tract: Secondary | ICD-10-CM | POA: Insufficient documentation

## 2016-04-25 DIAGNOSIS — R9389 Abnormal findings on diagnostic imaging of other specified body structures: Secondary | ICD-10-CM

## 2016-04-25 DIAGNOSIS — R918 Other nonspecific abnormal finding of lung field: Secondary | ICD-10-CM | POA: Insufficient documentation

## 2016-04-25 DIAGNOSIS — N281 Cyst of kidney, acquired: Secondary | ICD-10-CM | POA: Diagnosis not present

## 2016-04-25 LAB — POCT I-STAT CREATININE
CREATININE: 0.4 mg/dL — AB (ref 0.44–1.00)
Creatinine, Ser: 0.4 mg/dL — ABNORMAL LOW (ref 0.44–1.00)

## 2016-04-25 MED ORDER — IOPAMIDOL (ISOVUE-300) INJECTION 61%
100.0000 mL | Freq: Once | INTRAVENOUS | Status: AC | PRN
  Administered 2016-04-25: 100 mL via INTRAVENOUS

## 2016-05-02 ENCOUNTER — Ambulatory Visit (INDEPENDENT_AMBULATORY_CARE_PROVIDER_SITE_OTHER): Payer: BLUE CROSS/BLUE SHIELD | Admitting: Gastroenterology

## 2016-05-02 ENCOUNTER — Encounter: Payer: Self-pay | Admitting: Gastroenterology

## 2016-05-02 ENCOUNTER — Ambulatory Visit: Payer: BLUE CROSS/BLUE SHIELD | Admitting: Gastroenterology

## 2016-05-02 VITALS — BP 132/80 | HR 61 | Ht 60.0 in | Wt 148.0 lb

## 2016-05-02 DIAGNOSIS — K7689 Other specified diseases of liver: Secondary | ICD-10-CM

## 2016-05-02 DIAGNOSIS — R918 Other nonspecific abnormal finding of lung field: Secondary | ICD-10-CM | POA: Diagnosis not present

## 2016-05-02 NOTE — Progress Notes (Signed)
Primary Care Physician: Bobetta Lime, MD  Primary Gastroenterologist:  Dr. Jonathon Bellows   Here today with spanish interpretor.   Chief Complaint  Patient presents with  . Follow-up    colonoscopy & xray   She is here today for follow up . Initial visit on 03/14/16 when she was seen for abdominal pain in the LLQ Ct abdomen showed stable lesions of the liver but multiple pulmonary small nodules.She had a small tubular adenoma on her colonoscopy which was otherwise normal.  Interval history 03/2016-04/2016  No abdominal pain Discussed results of Ct scan with her.     Current Outpatient Prescriptions  Medication Sig Dispense Refill  . aspirin EC 81 MG tablet Take 1 tablet (81 mg total) by mouth daily.    Marland Kitchen glucose blood (BAYER CONTOUR TEST) test strip BAYER CONTOUR TEST (In Vitro Strip)  1 (one) Strip two times daily, as needed for 30 days  Quantity: 60;  Refills: 5   Ordered :30-Jun-2014  Bobetta Lime MD;  Started 30-Jun-2014 Active Comments: Medication taken as needed.     Marland Kitchen lisinopril-hydrochlorothiazide (PRINZIDE,ZESTORETIC) 10-12.5 MG tablet Take 1 tablet by mouth daily. 90 tablet 1  . metFORMIN (GLUCOPHAGE) 500 MG tablet Take 1 tablet (500 mg total) by mouth daily. 90 tablet 0  . SUMAtriptan (IMITREX) 50 MG tablet Take 1 tablet (50 mg total) by mouth once. May repeat in 2 hours if headache persists or recurs. 10 tablet 5  . timolol (BETIMOL) 0.5 % ophthalmic solution 1 drop 2 (two) times daily.     No current facility-administered medications for this visit.     Allergies as of 05/02/2016 - Review Complete 05/02/2016  Allergen Reaction Noted  . Ibuprofen Swelling 12/22/2014  . Shrimp [shellfish allergy] Swelling 12/22/2014    ROS:  General: Negative for anorexia, weight loss, fever, chills, fatigue, weakness. ENT: Negative for hoarseness, difficulty swallowing , nasal congestion. CV: Negative for chest pain, angina, palpitations, dyspnea on exertion,  peripheral edema.  Respiratory: Negative for dyspnea at rest, dyspnea on exertion, cough, sputum, wheezing.  GI: See history of present illness. GU:  Negative for dysuria, hematuria, urinary incontinence, urinary frequency, nocturnal urination.  Endo: Negative for unusual weight change.    Physical Examination:   BP 132/80   Pulse 61   Ht 5' (1.524 m)   Wt 148 lb (67.1 kg)   BMI 28.90 kg/m   General: Well-nourished, well-developed in no acute distress.  Eyes: No icterus. Conjunctivae pink. Mouth: Oropharyngeal mucosa moist and pink , no lesions erythema or exudate. Lungs: Clear to auscultation bilaterally. Non-labored. Heart: Regular rate and rhythm, no murmurs rubs or gallops.  Abdomen: Bowel sounds are normal, nontender, nondistended, no hepatosplenomegaly or masses, no abdominal bruits or hernia , no rebound or guarding.   Extremities: No lower extremity edema. No clubbing or deformities. Neuro: Alert and oriented x 3.  Grossly intact. Skin: Warm and dry, no jaundice.   Psych: Alert and cooperative, normal mood and affect.   Imaging Studies: Ct Abdomen Pelvis W Contrast  Result Date: 04/25/2016 CLINICAL DATA:  61 year old with uterine cancer in 2005 and hysterectomy. Recent colonoscopy. Follow-up of indeterminate area in the liver. EXAM: CT ABDOMEN AND PELVIS WITH CONTRAST TECHNIQUE: Multidetector CT imaging of the abdomen and pelvis was performed using the standard protocol following bolus administration of intravenous contrast. CONTRAST:  182mL ISOVUE-300 IOPAMIDOL (ISOVUE-300) INJECTION 61% COMPARISON:  02/29/2016 and 03/25/2007.  Renal ultrasound 04/11/2016 FINDINGS: Lower chest: There are numerous small nodules at both lung bases,  many of these nodules are pleural-based. Majority of the nodules are unchanged since 02/29/2016. Some of the nodules near the right minor fissure were not imaged on the previous examination. No pleural effusions. Largest nodule measures 4 mm in left  lower lobe on sequence 4, image 13. There are at least 3 subtle ground-glass densities in the right lower lobe best seen on the coronal formats, sequence 5, image 66. These ground-glass areas are poorly defined, largest measuring roughly 1 cm. These areas in the right lower lobe were not imaged on the previous examination. Hepatobiliary: Again noted is a 5 mm low density at the right hepatic dome on sequence 2, image 22 which is unchanged. There are at least 2 additional other punctate low densities at the hepatic dome which are unchanged. No suspicious liver lesion. The portal venous system is patent. Gallbladder is normal without biliary dilatation. Pancreas: Normal appearance of the pancreas without inflammation or duct dilatation. Spleen: Normal appearance of spleen without enlargement. Adrenals/Urinary Tract: Normal adrenal glands. Again noted are small bilateral renal cysts. Largest cyst is exophytic in the posterior right kidney measuring 1.9 cm. The Hounsfield units measure roughly 26 and not significantly changed on the delayed images. This was found to be a simple cyst on recent ultrasound. The cyst has enlarged since 2008 but no significant changed since 02/29/2016. No hydronephrosis. Urinary bladder is unremarkable. Stomach/Bowel: There is a thin wavy radiopaque density within a loop of jejunum in the left upper abdomen on sequence 2, image 34. Structure measures at least 5 cm in length and compatible with some type of ingested material. This was not present on the recent comparison examination. Normal appearance of stomach and duodenum. No evidence for bowel dilatation, obstruction or focal inflammation. Vascular/Lymphatic: Mild atherosclerotic disease in the aorta without aneurysm. There is no significant lymph node enlargement in the abdomen or pelvis. Again noted are surgical clips between the aorta and IVC. Reproductive: Status post hysterectomy. No adnexal masses. Other: No abdominal or pelvic  ascites.  Negative for free air. Musculoskeletal: Persistent sclerosis and irregularity involving the posterior left iliac bone. Facet arthropathy in the lower lumbar spine. Grade 1 anterolisthesis at L4-L5. Stable compression deformities at T11 and T12. IMPRESSION: No acute abnormality in the abdomen or pelvis. Subtle ground-glass opacities in the right lower lobe, particularly in the superior segment. These probably represent a subtle infectious or inflammatory process. Recommend clinical correlation. Non-contrast chest CT at 3-6 months is recommended. If nodules persist, subsequent management will be based upon the most suspicious nodule(s). This recommendation follows the consensus statement: Guidelines for Management of Incidental Pulmonary Nodules Detected on CT Images: From the Fleischner Society 2017; Radiology 2017; 284:228-243. Stable low-density areas at the hepatic dome. These probably represent benign etiologies such as small cysts but too small to definitively characterize. Bilateral renal cysts. Multiple small pulmonary nodules at the lung bases. Many of these small pulmonary nodules are unchanged. Some of the nodules were not imaged on the prior examinations. Largest nodule measures 4 mm. No follow-up needed if patient is low-risk (and has no known or suspected primary neoplasm). Non-contrast chest CT can be considered in 12 months if patient is high-risk. This recommendation follows the consensus statement: Guidelines for Management of Incidental Pulmonary Nodules Detected on CT Images: From the Fleischner Society 2017; Radiology 2017; 284:228-243. Thin radiopaque density measuring roughly 5 cm within the small bowel. This probably represents ingested material. Electronically Signed   By: Markus Daft M.D.   On: 04/25/2016 16:23  US Renal  Result Date: 04/11/2016 CLINICAL DATA:  Enlarging intermediate density right renal lesion on CT. History of ovarian cancer. EXAM: RENAL / URINARY TRACT  ULTRASOUND COMPLETE COMPARISON:  CT 03/25/2007 and 02/29/2016. FINDINGS: Right Kidney: Length: 10.6 cm. There is a simple exophytic cyst projecting laterally from the lower interpolar region, measuring 1.6 x 1.6 x 1.4 cm. This corresponds with the CT finding. No evidence of solid renal mass or hydronephrosis. Left Kidney: Length: 11.9 cm. Echogenicity within normal limits. No mass or hydronephrosis visualized. Bladder: Appears normal for degree of bladder distention. IMPRESSION: 1. Simple right renal cyst corresponding with enlarging finding on CT. No worrisome renal findings. 2. No hydronephrosis. Electronically Signed   By: Richardean Sale M.D.   On: 04/11/2016 09:07    Assessment and Plan:  Jackie Fox is a 61 y.o. y/o female has been referred for LLQ abdominal pain.The pain has resolved since last visit. She had a CT scan which showed some pulmonary nodules and stable liver lesions.   Plan  1. Stable lesions on her liver- suggest repeat USG liver in 6 months to ensure it is stable.  2. Multiple pulmonary nodules seen on Ct scan - refer to Pulmonary for surveillance.    Dr Jonathon Bellows  MD F/u in 8 months.

## 2016-05-02 NOTE — Patient Instructions (Addendum)
Ultra Sound appointment for July 3,2018 @ [facility] 9:00 AM Nothing to Eat 6 hours prior to ultra sound.   And Follow up appointment August 7 , 2018 @ 1:30 PM

## 2016-05-08 ENCOUNTER — Other Ambulatory Visit: Payer: Self-pay | Admitting: Family Medicine

## 2016-05-08 DIAGNOSIS — E119 Type 2 diabetes mellitus without complications: Secondary | ICD-10-CM

## 2016-05-10 NOTE — Telephone Encounter (Signed)
Last Cr reviewed; last GI note reviewed; Rx approved

## 2016-06-13 ENCOUNTER — Other Ambulatory Visit: Payer: Self-pay

## 2016-06-14 MED ORDER — GLUCOSE BLOOD VI STRP: 1.0000 | ORAL_STRIP | Freq: Every day | 1 refills | Status: DC | PRN

## 2016-08-11 ENCOUNTER — Other Ambulatory Visit: Payer: Self-pay

## 2016-08-11 ENCOUNTER — Other Ambulatory Visit: Payer: Self-pay | Admitting: Family Medicine

## 2016-08-11 DIAGNOSIS — G43009 Migraine without aura, not intractable, without status migrainosus: Secondary | ICD-10-CM

## 2016-08-11 DIAGNOSIS — E119 Type 2 diabetes mellitus without complications: Secondary | ICD-10-CM

## 2016-08-11 NOTE — Telephone Encounter (Signed)
Pt has an appt 08/22/16 Last Cr reviewed Rx approved

## 2016-08-12 MED ORDER — SUMATRIPTAN SUCCINATE 50 MG PO TABS: 50.0000 mg | ORAL_TABLET | Freq: Once | ORAL | 5 refills | Status: DC

## 2016-08-14 ENCOUNTER — Other Ambulatory Visit: Payer: Self-pay | Admitting: Family Medicine

## 2016-08-14 DIAGNOSIS — I10 Essential (primary) hypertension: Secondary | ICD-10-CM

## 2016-08-14 NOTE — Telephone Encounter (Signed)
Last cr and K+ reviewed; Rx approved 

## 2016-08-22 ENCOUNTER — Ambulatory Visit (INDEPENDENT_AMBULATORY_CARE_PROVIDER_SITE_OTHER): Payer: BLUE CROSS/BLUE SHIELD | Admitting: Family Medicine

## 2016-08-22 ENCOUNTER — Encounter: Payer: Self-pay | Admitting: Family Medicine

## 2016-08-22 ENCOUNTER — Other Ambulatory Visit: Payer: Self-pay

## 2016-08-22 VITALS — BP 126/74 | HR 71 | Temp 98.4°F | Resp 16 | Wt 153.4 lb

## 2016-08-22 DIAGNOSIS — G8929 Other chronic pain: Secondary | ICD-10-CM

## 2016-08-22 DIAGNOSIS — I10 Essential (primary) hypertension: Secondary | ICD-10-CM

## 2016-08-22 DIAGNOSIS — E119 Type 2 diabetes mellitus without complications: Secondary | ICD-10-CM

## 2016-08-22 DIAGNOSIS — E785 Hyperlipidemia, unspecified: Secondary | ICD-10-CM

## 2016-08-22 DIAGNOSIS — M25561 Pain in right knee: Secondary | ICD-10-CM

## 2016-08-22 DIAGNOSIS — Z5181 Encounter for therapeutic drug level monitoring: Secondary | ICD-10-CM

## 2016-08-22 DIAGNOSIS — Z1231 Encounter for screening mammogram for malignant neoplasm of breast: Secondary | ICD-10-CM

## 2016-08-22 DIAGNOSIS — G43009 Migraine without aura, not intractable, without status migrainosus: Secondary | ICD-10-CM

## 2016-08-22 DIAGNOSIS — J301 Allergic rhinitis due to pollen: Secondary | ICD-10-CM

## 2016-08-22 DIAGNOSIS — L255 Unspecified contact dermatitis due to plants, except food: Secondary | ICD-10-CM

## 2016-08-22 LAB — COMPLETE METABOLIC PANEL WITH GFR
ALBUMIN: 3.8 g/dL (ref 3.6–5.1)
ALK PHOS: 59 U/L (ref 33–130)
ALT: 12 U/L (ref 6–29)
AST: 15 U/L (ref 10–35)
BUN: 14 mg/dL (ref 7–25)
CALCIUM: 9.2 mg/dL (ref 8.6–10.4)
CHLORIDE: 102 mmol/L (ref 98–110)
CO2: 27 mmol/L (ref 20–31)
Creat: 0.53 mg/dL (ref 0.50–0.99)
Glucose, Bld: 95 mg/dL (ref 65–99)
POTASSIUM: 4.1 mmol/L (ref 3.5–5.3)
Sodium: 141 mmol/L (ref 135–146)
Total Bilirubin: 0.7 mg/dL (ref 0.2–1.2)
Total Protein: 6.9 g/dL (ref 6.1–8.1)

## 2016-08-22 LAB — LIPID PANEL
Cholesterol: 198 mg/dL (ref <200)
HDL: 49 mg/dL — AB (ref >50)
LDL CALC: 128 mg/dL — AB (ref <100)
TRIGLYCERIDES: 103 mg/dL (ref <150)
Total CHOL/HDL Ratio: 4 Ratio (ref <5.0)
VLDL: 21 mg/dL (ref <30)

## 2016-08-22 MED ORDER — FLUTICASONE PROPIONATE 50 MCG/ACT NA SUSP: 2.0000 | Freq: Every day | NASAL | 11 refills | Status: DC

## 2016-08-22 MED ORDER — SUMATRIPTAN SUCCINATE 50 MG PO TABS: 50.0000 mg | ORAL_TABLET | Freq: Once | ORAL | 5 refills | Status: AC

## 2016-08-22 MED ORDER — LISINOPRIL-HYDROCHLOROTHIAZIDE 10-12.5 MG PO TABS: 1.0000 | ORAL_TABLET | Freq: Every day | ORAL | 1 refills | Status: DC

## 2016-08-22 MED ORDER — FLUTICASONE PROPIONATE 50 MCG/ACT NA SUSP: 2.0000 | Freq: Every day | NASAL | 11 refills | Status: AC

## 2016-08-22 MED ORDER — METFORMIN HCL 500 MG PO TABS: 500.0000 mg | ORAL_TABLET | Freq: Every day | ORAL | 1 refills | Status: DC

## 2016-08-22 MED ORDER — TRIAMCINOLONE ACETONIDE 0.1 % EX CREA: 1.0000 "application " | TOPICAL_CREAM | Freq: Two times a day (BID) | CUTANEOUS | 1 refills | Status: DC

## 2016-08-22 NOTE — Assessment & Plan Note (Signed)
Happening less often; suggest magnesium 250 mg daily

## 2016-08-22 NOTE — Assessment & Plan Note (Signed)
Patient does not want to take a statin; she will try to decrease cheese intake; see AVS; showed model of atherosclerotic artery

## 2016-08-22 NOTE — Assessment & Plan Note (Signed)
Will start nasal corticosteroid; if cough persists after treatment for postnasal drip, will switch ACE-I to ARB

## 2016-08-22 NOTE — Assessment & Plan Note (Signed)
Seen by ortho, wearing brace

## 2016-08-22 NOTE — Assessment & Plan Note (Signed)
Well-controlled today; continue same; if cough persists after treatment with nasal corticosteroid, consider stopping ACE-I and switching to ARB

## 2016-08-22 NOTE — Patient Instructions (Addendum)
Try magnesium oxide 250 mg once a day to help prevent migraines if desired Try to limit saturated fats in your diet (bologna, hot dogs, barbeque, cheeseburgers, hamburgers, steak, bacon, sausage, cheese, etc.) and get more fresh fruits, vegetables, and whole grains We'll contact you about your lab results by phone, Giovany Try the new nasal spray for allergies If your cough persists, please let me know   Colesterol (Cholesterol) El colesterol es una sustancia blanca y cerosa parecida a la grasa que el organismo necesita en pequeas cantidades. El hgado fabrica todo el colesterol que necesita. La sangre lo transporta desde el hgado a travs de los vasos sanguneos. Los depsitos de colesterol (placa) pueden acumularse en las paredes de los vasos sanguneos, lo que ocasiona el estrechamiento y la rigidez de las arterias. Las placas de colesterol aumentan el riesgo de ataque cardaco e ictus. Aunque sea muy elevado, la concentracin de colesterol no puede percibirse. La nica forma de saber que tiene colesterol alto es mediante un anlisis de [place]. Una vez que se conocen las concentraciones de Research officer, trade union, se Mining engineer un registro de los [place] de [place]. Trabaje con el mdico para [employer] en el rango deseado. [place]?  El colesterol total es una medida general de todo el colesterol en [place].  El LDL es el que se conoce como colesterol malo y es el que se deposita en las paredes de las arterias. Su concentracin debe ser baja.  El HDL es el colesterol bueno porque limpia las arterias y arrastra el LDL. Su concentracin debe ser alta.  Los triglicridos son grasas que el cuerpo puede quemar ya sea para energa o para almacenamiento. Las concentraciones altas estn estrechamente vinculadas con las enfermedades cardacas. CULES SON LAS CONCENTRACIONES DE COLESTEROL DESEADAS?  El colesterol total por debajo de 200.  El LDL por debajo de  100 en las personas en riesgo y por debajo de 16 en aquellas que corren un riesgo muy alto.  El HDL por [place] de 50es bueno y por [place] de 60 es lo mejor.  Los triglicridos por debajo de 150. [place]?  Dieta Siga los programas de alimentacin que el mdico le indique.  Elija el pescado o la carne blanca de pollo y [place], asados u horneados. Limite los cortes grasos de carne roja, los alimentos fritos y las carnes procesadas, como las salchichas y los embutidos.  Coma gran cantidad de frutas y verduras frescas.  Elija los cereales integrales, los frijoles, las pastas, las papas y los cereales.  Use solamente pequeas cantidades de aceite de oliva, maz o canola.  No coma mantequilla, [place], margarina o aceites de [place].  Evite los alimentos que contengan grasas trans.  Tome leche semidescremada o sin grasa y coma yogur y quesos bajos en grasas o sin grasa. Evite la Mattel, la crema, los [place], las yemas de [place] y los quesos enteros.  Los postres sanos incluyen la torta ngel, los bocadillos de [place], las Administrator con forma de [place], los caramelos duros, los helados de agua y el yogur bajo en grasa o sin grasa. Evite las [place], tortas, pasteles y [place].  Haga actividad fsica. Siga los programas de ejercicios segn las indicaciones del mdico.  Un programa regular ayuda a bajar el colesterol LDL y aumentar el HDL,  y a Technical sales engineer.  Haga cosas que aumenten su nivel de [place], por [place], haga [place], salga a caminar o suba y [place] escaleras. Pregntele  al mdico cmo puede aumentar la actividad en su vida cotidiana.  Medicamentos. Tome todos los [employer] como le indic el mdico.  El mdico puede recetarle medicamentos para ayudar a Management consultant colesterol y reducir el riesgo de enfermedades cardacas.  Si tiene varios factores de riesgo, tal vez tenga que tomar medicamentos, incluso si las concentraciones son  normales. Esta informacin no tiene Marine scientist el consejo del mdico. Asegrese de hacerle al mdico cualquier pregunta que tenga. Document Released: 01/04/2005 Document Revised: 04/17/2014 Document Reviewed: 09/25/2015 Elsevier Interactive Patient Education  2017 [place] diabetes mellitus y los alimentos (Diabetes Mellitus and Food) Es importante que controle su nivel de azcar en la sangre (glucosa). El nivel de glucosa en sangre depende en gran medida de lo que usted come. [place] alimentos saludables en las cantidades [place] a lo largo del Training and development officer, aproximadamente a la misma hora [employer], lo ayudar a Chief Technology Officer su nivel de Multimedia programmer. Tambin puede ayudarlo a retrasar o Patent attorney de la diabetes mellitus. [place] de [employer] saludable incluso puede ayudarlo a Chartered loss adjuster de presin arterial y a Science writer o Theatre manager un peso saludable. Entre las recomendaciones generales para alimentarse y Audiological scientist los alimentos de forma saludable, se incluyen las siguientes:  Respetar las comidas principales y comer colaciones con regularidad. Evitar pasar largos perodos sin comer con el fin de perder peso.  Seguir una dieta que consista principalmente en alimentos de origen vegetal, como frutas, vegetales, frutos secos, legumbres y cereales integrales.  Utilizar mtodos de coccin a baja temperatura, como hornear, en lugar de mtodos de coccin a alta temperatura, como frer en abundante aceite. Trabaje con el nutricionista para aprender a Financial planner nutricional de las etiquetas de los alimentos. CMO PUEDEN AFECTARME LOS ALIMENTOS? Carbohidratos Los carbohidratos afectan el nivel de glucosa en sangre ms que cualquier otro tipo de alimento. El nutricionista lo ayudar a Teacher, adult education cuntos carbohidratos puede consumir en cada comida y ensearle a contarlos. El recuento de carbohidratos es importante para mantener la glucosa en sangre en un nivel saludable, en  especial si utiliza insulina o toma determinados medicamentos para la diabetes mellitus. Alcohol El alcohol puede provocar disminuciones sbitas de la glucosa en sangre (hipoglucemia), en especial si utiliza insulina o toma determinados medicamentos para la diabetes mellitus. La hipoglucemia es una afeccin que puede poner en peligro la vida. Los sntomas de la hipoglucemia (somnolencia, mareos y Data processing manager) son similares a los sntomas de haber consumido mucho alcohol. Si el mdico lo autoriza a beber alcohol, hgalo con moderacin y siga estas pautas:  Las mujeres no deben beber ms de un trago por da, y los hombres no deben beber ms de dos tragos por Training and development officer. Un trago es igual a:  12 onzas (355 ml) de cerveza  5 onzas de vino (150 ml) de vino  1,5onzas (1ml) de bebidas espirituosas  No beba con el estmago vaco.  Mantngase hidratado. Beba agua, gaseosas dietticas o t helado sin azcar.  Las gaseosas comunes, los jugos y otros refrescos podran contener muchos carbohidratos y se Civil Service fast streamer. QU ALIMENTOS NO SE RECOMIENDAN? Cuando haga las elecciones de alimentos, es importante que recuerde que todos los alimentos son distintos. Algunos tienen menos nutrientes que otros por porcin, aunque podran tener la misma cantidad de caloras o carbohidratos. Es difcil darle al cuerpo lo que necesita cuando consume alimentos con menos nutrientes. Estos son algunos ejemplos de alimentos que debera evitar ya que contienen muchas caloras y carbohidratos, pero pocos  nutrientes:  Grasas trans (la mayora de los alimentos procesados incluyen grasas trans en la etiqueta de Informacin nutricional).  Gaseosas comunes.  Jugos.  Caramelos.  Dulces, como tortas, pasteles, rosquillas y [place].  Comidas fritas. QU ALIMENTOS PUEDO COMER? Consuma alimentos ricos en nutrientes, que nutrirn el cuerpo y lo mantendrn saludable. Los alimentos que debe comer tambin dependern de varios  factores, como:  Las caloras que necesita.  Los medicamentos que toma.  Su peso.  El nivel de glucosa en [place].  El [place] de presin arterial.  El nivel de colesterol. Debe consumir una amplia variedad de alimentos, por ejemplo:  Protenas.  Cortes de [employer].  Protenas con bajo contenido de grasas saturadas, como pescado, clara de huevo y frijoles. Evite las carnes procesadas.  Frutas y vegetales.  Frutas y Photographer que pueden ayudar a Chief Technology Officer los niveles sanguneos de [place], como [place], mangos y batatas.  Productos lcteos.  Elija productos lcteos sin grasa o con bajo contenido de [place], como [place], yogur y [place].  Cereales, panes, pastas y arroz.  Elija cereales integrales, como panes multicereales, avena en grano y arroz integral. Estos alimentos pueden ayudar a controlar la presin arterial.  Daphene Jaeger.  Alimentos que contengan grasas saludables, como frutos secos, Musician, aceite de [place], aceite de canola y pescado. TODOS LOS QUE PADECEN DIABETES MELLITUS TIENEN EL [place] PLAN DE [place]? Dado que todas las personas que padecen diabetes mellitus son distintas, no hay un solo plan de comidas que funcione para todos. Es muy importante que se rena con un nutricionista que lo ayudar a crear un plan de comidas adecuado para usted. Esta informacin no tiene Marine scientist el consejo del mdico. Asegrese de hacerle al mdico cualquier pregunta que tenga. Document Released: 07/04/2007 Document Revised: 04/17/2014 Document Reviewed: 02/21/2013 Elsevier Interactive Patient Education  2017 [employer].

## 2016-08-22 NOTE — Progress Notes (Signed)
BP 126/74   Pulse 71   Temp 98.4 F (36.9 C) (Oral)   Resp 16   Wt 153 lb 6.4 oz (69.6 kg)   SpO2 97%   BMI 29.96 kg/m    Subjective:    Patient ID: Jackie Fox, female    DOB: [birth year], 61 y.o.   MRN: [id number]  HPI: Jackie Fox is a 61 y.o. female  Chief Complaint  Patient presents with  . Follow-up    HPI Diabetes Type 2 diabetes She thinks she has it under control, checks sugars a few times a week; two hours before breakfast, 69 or so; no low sugars, not jittery; trying to be disciplined with meals; just taking one pill a day; avoid sugary drinks, avoids sodas  Hypertension; on the combination pill  High cholesterol; eats eggs maybe 3 times a week, 3 yolks per week, alternates just the whites; not many fatty meats; does eat cheese, though, twice a day; does not want to be on pill for cholesterol  Migraines; she had one two weeks ago, took medicine and it took care of it; used to be more frequent, might go two months in between migraines  Shingles vaccine discussed; declined today  Come cough, drainage, post-nasal drip, runny nose  Also got into some poison ivy on her legs  Due for mammogram; no lumps  She has seen an orthopaedist for right knee pain; wearing brace today  Depression screen [facility] 2/9 08/22/2016 04/04/2016 02/22/2016 03/23/2015 12/22/2014  Decreased Interest 0 0 0 0 0  Down, Depressed, Hopeless 0 0 0 0 0  PHQ - 2 Score 0 0 0 0 0   Relevant past medical, surgical, family and social history reviewed Past Medical History:  Diagnosis Date  . Diabetes mellitus without complication ([place])   . Hyperlipidemia LDL goal <70 12/22/2014  . Hypertension   . Migraine   . Ovarian cancer [facility]) 2005   hysterectomy and chemo x 4   Past Surgical History:  Procedure Laterality Date  . ABDOMINAL HYSTERECTOMY  2005  . BREAST BIOPSY Left 1990  . COLONOSCOPY WITH PROPOFOL N/A 04/17/2016   Procedure: COLONOSCOPY WITH PROPOFOL;  Surgeon: Jonathon Bellows, MD;   Location: [facility] ENDOSCOPY;  Service: Endoscopy;  Laterality: N/A;   Family History  Problem Relation Age of Onset  . Hyperlipidemia Mother   . Heart disease Mother   . Hypertension Mother   . Liver cancer Father   . Breast cancer Neg Hx    Social History   Social History  . Marital status: Married    Spouse name: N/A  . Number of children: N/A  . Years of education: N/A   Occupational History  . Not on file.   Social History Main Topics  . Smoking status: Never Smoker  . Smokeless tobacco: Never Used  . Alcohol use No  . Drug use: No  . Sexual activity: Yes    Partners: Male   Other Topics Concern  . Not on file   Social History Narrative  . No narrative on file   Interim medical history since last visit reviewed. Allergies and medications reviewed  Review of Systems Per HPI unless specifically indicated above     Objective:    BP 126/74   Pulse 71   Temp 98.4 F (36.9 C) (Oral)   Resp 16   Wt 153 lb 6.4 oz (69.6 kg)   SpO2 97%   BMI 29.96 kg/m   Wt Readings from Last 3 Encounters:  08/22/16  153 lb 6.4 oz (69.6 kg)  05/02/16 148 lb (67.1 kg)  04/17/16 152 lb (68.9 kg)    Physical Exam  Constitutional: She appears well-developed and well-nourished. No distress.  HENT:  Head: Normocephalic and atraumatic.  Right Ear: Hearing, tympanic membrane, external ear and ear canal normal.  Left Ear: Hearing, tympanic membrane, external ear and ear canal normal.  Nose: Rhinorrhea present. No mucosal edema.  Mouth/Throat: Oropharynx is clear and moist and mucous membranes are normal.  Turbinates are swollen, mucosal tissue is pink  Eyes: EOM are normal. No scleral icterus.  Neck: No thyromegaly present.  Cardiovascular: Normal rate, regular rhythm and normal heart sounds.   No murmur heard. Pulmonary/Chest: Effort normal and breath sounds normal. No respiratory distress. She has no wheezes.  Abdominal: Soft. Bowel sounds are normal. She exhibits no distension.   Musculoskeletal: Normal range of motion. She exhibits no edema.  Wearing brace on right knee, hinged  Neurological: She is alert. She exhibits normal muscle tone.  Skin: Skin is warm and dry. She is not diaphoretic. No pallor.  Numerous erythematous papular lesions on both lower legs, many in linear arrangement, c/w contact plant dermatitis  Psychiatric: She has a normal mood and affect. Her behavior is normal. Judgment and thought content normal. Her mood appears not anxious. She does not exhibit a depressed mood.   Diabetic Foot Form - Detailed   Diabetic Foot Exam - detailed Diabetic Foot exam was performed with the following findings:  Yes 08/22/2016 11:25 AM  Visual Foot Exam completed.:  Yes  Are the toenails ingrown?:  No Normal Range of Motion:  Yes Pulse Foot Exam completed.:  Yes  Right Dorsalis Pedis:  Present Left Dorsalis Pedis:  Present  Sensory Foot Exam Completed.:  Yes Swelling:  No Semmes-Weinstein Monofilament Test R Site 1-Great Toe:  Pos L Site 1-Great Toe:  Pos  R Site 4:  Pos L Site 4:  Pos  R Site 5:  Pos L Site 5:  Pos        Results for orders placed or performed during the hospital encounter of 04/25/16  I-STAT creatinine  Result Value Ref Range   Creatinine, Ser 0.40 (L) 0.44 - 1.00 mg/dL  I-STAT creatinine  Result Value Ref Range   Creatinine, Ser 0.40 (L) 0.44 - 1.00 mg/dL      Assessment & Plan:   Problem List Items Addressed This Visit      Cardiovascular and Mediastinum   Migraine without aura and without status migrainosus, not intractable (Chronic)    Happening less often; suggest magnesium 250 mg daily      Relevant Medications   SUMAtriptan (IMITREX) 50 MG tablet   lisinopril-hydrochlorothiazide (PRINZIDE,ZESTORETIC) 10-12.5 MG tablet   HTN (hypertension) (Chronic)    Well-controlled today; continue same; if cough persists after treatment with nasal corticosteroid, consider stopping ACE-I and switching to ARB      Relevant  Medications   lisinopril-hydrochlorothiazide (PRINZIDE,ZESTORETIC) 10-12.5 MG tablet     Respiratory   Seasonal allergic rhinitis due to pollen    Will start nasal corticosteroid; if cough persists after treatment for postnasal drip, will switch ACE-I to ARB        Endocrine   Controlled diabetes mellitus type II without complication (HCC) - Primary (Chronic)   Relevant Medications   metFORMIN (GLUCOPHAGE) 500 MG tablet   lisinopril-hydrochlorothiazide (PRINZIDE,ZESTORETIC) 10-12.5 MG tablet     Other   Hyperlipidemia LDL goal <70    Patient does not want to take  a statin; she will try to decrease cheese intake; see AVS; showed model of atherosclerotic artery      Relevant Medications   lisinopril-hydrochlorothiazide (PRINZIDE,ZESTORETIC) 10-12.5 MG tablet   Gonalgia    Seen by ortho, wearing brace       Other Visit Diagnoses    Screening mammogram, encounter for       ordered screening mammogram   Relevant Orders   MM DIGITAL SCREENING BILATERAL   Hypertension goal BP (blood pressure) < 140/90       Relevant Medications   lisinopril-hydrochlorothiazide (PRINZIDE,ZESTORETIC) 10-12.5 MG tablet   Contact dermatitis due to plant       triamcinolone cream to lesions PRN      Follow up plan: Return in about 6 months (around 02/22/2017) for twenty minute follow-up with fasting labs.  An after-visit summary was printed and given to the patient at [place].  Please see the patient instructions which may contain other information and recommendations beyond what is mentioned above in the assessment and plan.  Meds ordered this encounter  Medications  . DISCONTD: fluticasone (FLONASE) 50 MCG/ACT nasal spray    Sig: Place 2 sprays into both nostrils daily. Dos bombeos en cada nostril cada dia    Dispense:  16 g    Refill:  11  . triamcinolone cream (KENALOG) 0.1 %    Sig: Apply 1 application topically 2 (two) times daily. aplicar dos o tres veces al dia    Dispense:  45 g     Refill:  1  . fluticasone (FLONASE) 50 MCG/ACT nasal spray    Sig: Place 2 sprays into both nostrils daily. Dos bombeos en cada nostril cada dia    Dispense:  16 g    Refill:  11  . metFORMIN (GLUCOPHAGE) 500 MG tablet    Sig: Take 1 tablet (500 mg total) by mouth daily.    Dispense:  90 tablet    Refill:  1  . SUMAtriptan (IMITREX) 50 MG tablet    Sig: Take 1 tablet (50 mg total) by mouth once. May repeat in 2 hours if headache persists or recurs.    Dispense:  10 tablet    Refill:  5    Quantity per insurance limitation may fraction  . lisinopril-hydrochlorothiazide (PRINZIDE,ZESTORETIC) 10-12.5 MG tablet    Sig: Take 1 tablet by mouth daily.    Dispense:  90 tablet    Refill:  1  . Magnesium Oxide 250 MG TABS    Sig: Tome una tableta por boca cada dia    Dispense:  30 tablet    Refill:  11    Orders Placed This Encounter  Procedures  . MM DIGITAL SCREENING BILATERAL

## 2016-08-23 LAB — HEMOGLOBIN A1C
HEMOGLOBIN A1C: 5.8 % — AB (ref <5.7)
Mean Plasma Glucose: 120 mg/dL

## 2016-10-10 ENCOUNTER — Ambulatory Visit: Payer: BLUE CROSS/BLUE SHIELD

## 2016-10-17 ENCOUNTER — Ambulatory Visit
Admission: RE | Admit: 2016-10-17 | Discharge: 2016-10-17 | Disposition: A | Discharge status: Home / Self Care | Payer: BLUE CROSS/BLUE SHIELD | Source: Ambulatory Visit | Attending: Gastroenterology | Admitting: Gastroenterology

## 2016-10-17 DIAGNOSIS — K7689 Other specified diseases of liver: Secondary | ICD-10-CM

## 2016-10-19 ENCOUNTER — Ambulatory Visit: Payer: BLUE CROSS/BLUE SHIELD | Admitting: Family Medicine

## 2016-10-24 ENCOUNTER — Encounter: Payer: Self-pay | Admitting: Family Medicine

## 2016-10-24 ENCOUNTER — Other Ambulatory Visit: Payer: Self-pay

## 2016-10-24 ENCOUNTER — Telehealth: Payer: Self-pay

## 2016-10-24 ENCOUNTER — Ambulatory Visit (INDEPENDENT_AMBULATORY_CARE_PROVIDER_SITE_OTHER): Payer: BLUE CROSS/BLUE SHIELD | Admitting: Family Medicine

## 2016-10-24 ENCOUNTER — Other Ambulatory Visit
Admission: RE | Admit: 2016-10-24 | Discharge: 2016-10-24 | Disposition: A | Discharge status: Home / Self Care | Payer: BLUE CROSS/BLUE SHIELD | Source: Ambulatory Visit | Attending: Gastroenterology | Admitting: Gastroenterology

## 2016-10-24 VITALS — BP 124/72 | HR 79 | Temp 98.2°F | Resp 14 | Ht 60.0 in | Wt 160.7 lb

## 2016-10-24 DIAGNOSIS — N611 Abscess of the breast and nipple: Secondary | ICD-10-CM | POA: Diagnosis not present

## 2016-10-24 DIAGNOSIS — M40204 Unspecified kyphosis, thoracic region: Secondary | ICD-10-CM

## 2016-10-24 DIAGNOSIS — E119 Type 2 diabetes mellitus without complications: Secondary | ICD-10-CM | POA: Diagnosis not present

## 2016-10-24 DIAGNOSIS — E785 Hyperlipidemia, unspecified: Secondary | ICD-10-CM

## 2016-10-24 DIAGNOSIS — K769 Liver disease, unspecified: Secondary | ICD-10-CM

## 2016-10-24 LAB — COMPREHENSIVE METABOLIC PANEL
ALBUMIN: 3.7 g/dL (ref 3.5–5.0)
ALT: 14 U/L (ref 14–54)
ANION GAP: 7 (ref 5–15)
AST: 19 U/L (ref 15–41)
Alkaline Phosphatase: 58 U/L (ref 38–126)
BUN: 14 mg/dL (ref 6–20)
CO2: 26 mmol/L (ref 22–32)
Calcium: 9.1 mg/dL (ref 8.9–10.3)
Chloride: 106 mmol/L (ref 101–111)
Creatinine, Ser: 0.63 mg/dL (ref 0.44–1.00)
GFR calc Af Amer: >60 mL/min (ref >60)
GFR calc non Af Amer: >60 mL/min (ref >60)
GLUCOSE: 95 mg/dL (ref 65–99)
POTASSIUM: 3.7 mmol/L (ref 3.5–5.1)
Sodium: 139 mmol/L (ref 135–145)
Total Bilirubin: 0.6 mg/dL (ref 0.3–1.2)
Total Protein: 7.1 g/dL (ref 6.5–8.1)

## 2016-10-24 NOTE — Telephone Encounter (Signed)
-----   Message from Jonathon Bellows, MD sent at 10/22/2016  6:42 PM EDT ----- Jackie Fox  Inform.Marland Kitchenlesions in liver are not visualized well on ultrasound and MRI would be better- please arrange to help better characterize   C/C Dr Sanda Klein

## 2016-10-24 NOTE — Patient Instructions (Addendum)
Come fasting please for the next visit  Colesterol (Cholesterol) El colesterol es una sustancia blanca y cerosa parecida a la grasa que el organismo necesita en pequeas cantidades. El hgado fabrica todo el colesterol que necesita. La sangre lo transporta desde el hgado a travs de los vasos sanguneos. Los depsitos de colesterol (placa) pueden acumularse en las paredes de los vasos sanguneos, lo que ocasiona el estrechamiento y la rigidez de las arterias. Las placas de colesterol aumentan el riesgo de ataque cardaco e ictus. Aunque sea muy elevado, la concentracin de colesterol no puede percibirse. La nica forma de saber que tiene colesterol alto es mediante un anlisis de [place]. Una vez que se conocen las concentraciones de Research officer, trade union, se Mining engineer un registro de los [place] de [place]. Trabaje con el mdico para [employer] en el rango deseado. Minto?  El colesterol total es una medida general de todo el colesterol en [place].  El LDL es el que se conoce como colesterol malo y es el que se deposita en las paredes de las arterias. Su concentracin debe ser baja.  El HDL es el colesterol bueno porque limpia las arterias y arrastra el LDL. Su concentracin debe ser alta.  Los triglicridos son grasas que el cuerpo puede quemar ya sea para energa o para almacenamiento. Las concentraciones altas estn estrechamente vinculadas con las enfermedades cardacas.  CULES SON LAS CONCENTRACIONES DE COLESTEROL DESEADAS?  El colesterol total por debajo de 200.  El LDL por debajo de 100 en las personas en riesgo y por debajo de 24 en aquellas que corren un riesgo muy alto.  El HDL por [place] de 50es bueno y por [place] de 60 es lo mejor.  Los triglicridos por debajo de 150.  [place]?  Dieta Siga los programas de alimentacin que el mdico le indique. ? Elija el pescado o la carne blanca de pollo y [place], asados u  horneados. Limite los cortes grasos de carne roja, los alimentos fritos y las carnes procesadas, como las salchichas y los embutidos. ? Coma gran cantidad de frutas y verduras frescas. ? Elija los cereales integrales, los frijoles, las pastas, las papas y los cereales. ? Use solamente pequeas cantidades de aceite de oliva, maz o canola. ? No coma mantequilla, [place], margarina o aceites de [place]. ? Evite los alimentos que contengan grasas trans. ? [employer]ott semidescremada o sin grasa y coma yogur y quesos bajos en grasas o sin grasa. Evite la Mattel, la crema, los [place], las yemas de [place] y los quesos enteros. ? Los postres sanos incluyen la torta ngel, los bocadillos de [place], las Administrator con forma de [place], los caramelos duros, los helados de agua y el yogur bajo en grasa o sin grasa. Evite las [place], tortas, pasteles y [place].  Haga actividad fsica. Siga los programas de ejercicios segn las indicaciones del mdico. ? Un programa regular ayuda a Advertising copywriter LDL y aumentar el HDL, ? y a Technical sales engineer. ? Haga cosas que aumenten su nivel de [place], por [place], haga [place], salga a caminar o suba y [place] escaleras. Pregntele al mdico cmo puede aumentar la actividad en su vida cotidiana.  Medicamentos. Tome todos los medicamentos como le indic el mdico. ? El mdico puede recetarle medicamentos para ayudar a Advertising copywriter y reducir el riesgo de enfermedades cardacas. ? Si tiene varios factores de riesgo, tal vez tenga que tomar medicamentos, incluso si las concentraciones son  normales.  Esta informacin no tiene Marine scientist el consejo del mdico. Asegrese de hacerle al mdico cualquier pregunta que tenga. Document Released: 01/04/2005 Document Revised: 04/17/2014 Document Reviewed: 09/25/2015 Elsevier Interactive Patient Education  2017 [employer].  Diabetes y cuidados del pie (Diabetes and Foot Care) La diabetes puede  ser la causa de que el flujo sanguneo (circulacin) en las piernas y los pies sea deficiente. Debido a esto, la piel de los pies se torna ms delgada, se rompe con facilidad y se cura ms lentamente. La piel puede estar seca, despellejarse y Medical illustrator. Tambin pueden estar daados los nervios de las piernas y de los pies lo que provoca una disminucin de la sensibilidad. Es posible que no advierta heridas ms pequeas en los pies, que pueden causar infecciones graves. Cuidar sus pies es una de las cosas ms importantes que puede hacer por usted mismo. INSTRUCCIONES PARA EL CUIDADO EN EL HOGAR  Use siempre calzado, an dentro de su casa. No camine descalzo. Caminar descalzo facilita que se lastime.  Controle sus pies diariamente para observar ampollas, cortes y enrojecimiento. Si no puede ver la planta del pie, use un espejo o pdale ayuda a Nurse, children's.  Lave sus pies con agua tibia (no use agua caliente) y un [place]. Seque bien sus pies, y la zona [employer] dedos dando [place], hasta que estn completamente secos. Noremoje los pies, ya que esto puede resecar la piel.  Aplique una locin hidratante o vaselina (que no contenga alcohol ni perfume) en los pies y en las uas secas y [place]. No aplique locin entre los dedos.  Recorte las uas en forma recta. No escarbe debajo de las uas o alrededor [employer]. Lime los bordes de las uas con una lima o esmeril.  No corte las durezas o callosidades, ni trate de quitarlas con medicamentos.  Use calcetines de algodn o medias [employer]. Asegrese de que no le [employer]. Nouse calcetines que le lleguen a las rodillas, ya que podran disminuir el flujo de sangre a las piernas.  Use zapatos de cuero que le queden bien y que sean acolchados. Para amoldar los zapatos, clcelos slo algunas horas por da. Esto evitar lesiones en los pies. Revise siempre los zapatos antes de ponerlos para asegurarse de que no haya  objetos en su interior.  No cruce las piernas. Esto puede disminuir el flujo de sangre a los pies.  Si algo le ha raspado, cortado o lastimado la piel de los pies, mantenga la piel de esa zona limpia y [place]. Debe higienizar estas zonas con agua y un jabn suave. No limpie la zona con agua oxigenada, alcohol ni yodo.  Cuando se quite un vendaje adhesivo, asegrese de no daar la piel.  Si tiene una herida, obsrvela varias veces por da para asegurarse de que se est curando.  No use bolsas de agua caliente ni almohadillas trmicas. Podran causar quemaduras. Si ha perdido la sensibilidad en los pies o las piernas, no sabr lo que le est sucediendo hasta que sea demasiado tarde.  Asegrese de que su mdico le haga un examen completo de los pies por lo menos una vez al ao, o con ms frecuencia si usted tiene [employer]. Informe todos los cortes, llagas o moretones a su mdico inmediatamente.  SOLICITE ATENCIN MDICA SI:  Tiene una lesin que no se cura.  Tiene cortes o rajaduras en la piel.  Tiene una ua encarnada.  Nota una zona irritada  en las piernas o los pies.  Siente una sensacin de ardor u hormigueo en las piernas o los pies.  Siente dolor o calambres en las piernas o los pies.  Las piernas o los pies estn adormecidos.  Siente los pies siempre fros.  SOLICITE ATENCIN MDICA DE INMEDIATO SI:  Presenta enrojecimiento, hinchazn o aumento del dolor en una herida.  Nota una lnea roja que sube por pierna.  Aparece pus en la herida.  Le sube la fiebre o segn lo que le indique el mdico.  Advierte un olor ftido que proviene de una lcera o una herida.  Esta informacin no tiene Marine scientist el consejo del mdico. Asegrese de hacerle al mdico cualquier pregunta que tenga. Document Released: 03/27/2005 Document Revised: 07/19/2015 Document Reviewed: 09/03/2012 Elsevier Interactive Patient Education  2017 [employer].

## 2016-10-24 NOTE — Telephone Encounter (Signed)
Spoke to family member requesting callback from patient for results and recommendations per Dr. Vicente Males.   Inform.Marland Kitchenlesions in liver are not visualized well on ultrasound and MRI would be better- please arrange to help better characterize   Family member states they would pass by later today for the information due to the patient having an appt at [facility] today.

## 2016-10-24 NOTE — Progress Notes (Signed)
BP 124/72   Pulse 79   Temp 98.2 F (36.8 C) (Oral)   Resp 14   Ht 5' (1.524 m)   Wt 160 lb 11.2 oz (72.9 kg)   SpO2 98%   BMI 31.38 kg/m    Subjective:    Patient ID: Jackie Fox, female    DOB: January 09, 1956, 61 y.o.   MRN: [id number]  HPI: Jackie Fox is a 60 y.o. female  Chief Complaint  Patient presents with  . Insect Bite    Poss insect bite happen 3 weeks ago  on the right side of breast. Itchy and red with a little puss. Pt states its better but still concern     HPI She is here with her son who serves as interpreter; patient declined interpretation services  Just itchy and darined pus; it's better now Right breast; not many bumps, not shingles Drained and now almost gone  Gained some weight; always on her feet, cooking Watch creeping weight  Allergies are okay  Type 2 diabetes; she checks her sugars; last sugar was 83, 79 but none lower; highest sugar, 107 morning fasting; no sugary drinks, no sugar in the coffee  High cholesterol; just a little cheese, not much meat; mostly rice, vegetables, a little fish, chicken mostly Lab Results  Component Value Date   CHOL 198 08/22/2016   HDL 49 (L) 08/22/2016   LDLCALC 128 (H) 08/22/2016   TRIG 103 08/22/2016   CHOLHDL 4.0 08/22/2016  she does not want to take a statin  Accident 20 years ago, getting worse; back pain has been an ongoing thing  Depression screen [facility] 2/9 10/24/2016 08/22/2016 04/04/2016 02/22/2016 03/23/2015  Decreased Interest 0 0 0 0 0  Down, Depressed, Hopeless 0 0 0 0 0  PHQ - 2 Score 0 0 0 0 0    Relevant past medical, surgical, family and social history reviewed Past Medical History:  Diagnosis Date  . Diabetes mellitus without complication ([place])   . Hyperlipidemia LDL goal <70 12/22/2014  . Hypertension   . Migraine   . Ovarian cancer [facility]) 2005   hysterectomy and chemo x 4   Past Surgical History:  Procedure Laterality Date  . ABDOMINAL HYSTERECTOMY  2005  . BREAST BIOPSY  Left 1990  . COLONOSCOPY WITH PROPOFOL N/A 04/17/2016   Procedure: COLONOSCOPY WITH PROPOFOL;  Surgeon: Jonathon Bellows, MD;  Location: [facility] ENDOSCOPY;  Service: Endoscopy;  Laterality: N/A;   Family History  Problem Relation Age of Onset  . Hyperlipidemia Mother   . Heart disease Mother   . Hypertension Mother   . Liver cancer Father   . Breast cancer Neg Hx    Social History   Social History  . Marital status: Married    Spouse name: N/A  . Number of children: N/A  . Years of education: N/A   Occupational History  . Not on file.   Social History Main Topics  . Smoking status: Never Smoker  . Smokeless tobacco: Never Used  . Alcohol use No  . Drug use: No  . Sexual activity: Yes    Partners: Male   Other Topics Concern  . Not on file   Social History Narrative  . No narrative on file    Interim medical history since last visit reviewed. Allergies and medications reviewed  Review of Systems Per HPI unless specifically indicated above     Objective:    BP 124/72   Pulse 79   Temp 98.2 F (36.8 C) (  Oral)   Resp 14   Ht 5' (1.524 m)   Wt 160 lb 11.2 oz (72.9 kg)   SpO2 98%   BMI 31.38 kg/m   Wt Readings from Last 3 Encounters:  10/24/16 160 lb 11.2 oz (72.9 kg)  08/22/16 153 lb 6.4 oz (69.6 kg)  05/02/16 148 lb (67.1 kg)    Physical Exam  Constitutional: She appears well-developed and well-nourished. No distress.  HENT:  Head: Normocephalic and atraumatic.  Eyes: EOM are normal. No scleral icterus.  Neck: No thyromegaly present.  Cardiovascular: Normal rate, regular rhythm and normal heart sounds.   No murmur heard. Pulmonary/Chest: Effort normal and breath sounds normal. No respiratory distress. She has no wheezes.  Lateral aspect RIGHT breast, area of mild erythema, healing open site without active drainage consistent with previous abcess that had spontaneously ruptured; no induration; no red streaking; no masses; no peau d'orange changes  Abdominal:  Soft. Bowel sounds are normal. She exhibits no distension.  Musculoskeletal: Normal range of motion. She exhibits no edema.       Thoracic back: She exhibits deformity.  Thoracic kyphosis noted  Neurological: She is alert. She exhibits normal muscle tone.  Skin: Skin is warm and dry. She is not diaphoretic. No pallor.  Psychiatric: She has a normal mood and affect. Her behavior is normal. Judgment and thought content normal.   Diabetic Foot Form - Detailed   Diabetic Foot Exam - detailed Diabetic Foot exam was performed with the following findings:  Yes 10/24/2016  1:23 PM  Visual Foot Exam completed.:  Yes  Pulse Foot Exam completed.:  Yes  Right Dorsalis Pedis:  Present Left Dorsalis Pedis:  Present  Sensory Foot Exam Completed.:  Yes Semmes-Weinstein Monofilament Test R Site 1-Great Toe:  Pos L Site 1-Great Toe:  Pos           Assessment & Plan:   Problem List Items Addressed This Visit      Endocrine   Controlled diabetes mellitus type II without complication (HCC) (Chronic)    Foot exam by MD today        Musculoskeletal and Integument   Thoracic kyphosis    Encouraged DEXA scan      Relevant Orders   DG Bone Density     Other   Hyperlipidemia LDL goal <70    Patient does not want to take statins; will continue to try diet; weight loss       Other Visit Diagnoses    Breast abscess of female    -  Primary   resolving; no need for antibiotics at this time       Follow up plan: Return in about 4 months (around 02/23/2017) for thirty to forty minute follow-up with fasting labs, may schedule her and husband same day one hour.  An after-visit summary was printed and given to the patient at [place].  Please see the patient instructions which may contain other information and recommendations beyond what is mentioned above in the assessment and plan.  No orders of the defined types were placed in this encounter.   Orders Placed This Encounter  Procedures  .  DG Bone Density

## 2016-10-25 ENCOUNTER — Telehealth: Payer: Self-pay

## 2016-10-25 NOTE — Telephone Encounter (Signed)
Patient's son cancelled MRI due to travel out of town. Patient to callback to reschedule upon return in August.

## 2016-11-07 ENCOUNTER — Ambulatory Visit: Payer: BLUE CROSS/BLUE SHIELD

## 2016-11-07 NOTE — Assessment & Plan Note (Signed)
Foot exam by MD today 

## 2016-11-07 NOTE — Assessment & Plan Note (Signed)
Encouraged DEXA scan

## 2016-11-07 NOTE — Assessment & Plan Note (Signed)
Patient does not want to take statins; will continue to try diet; weight loss

## 2016-11-21 ENCOUNTER — Ambulatory Visit
Admission: RE | Admit: 2016-11-21 | Discharge: 2016-11-21 | Disposition: A | Discharge status: Home / Self Care | Payer: BLUE CROSS/BLUE SHIELD | Source: Ambulatory Visit | Attending: Family Medicine | Admitting: Family Medicine

## 2016-11-21 ENCOUNTER — Ambulatory Visit: Payer: BLUE CROSS/BLUE SHIELD

## 2016-11-21 DIAGNOSIS — Z1231 Encounter for screening mammogram for malignant neoplasm of breast: Secondary | ICD-10-CM | POA: Insufficient documentation

## 2016-11-21 LAB — HM MAMMOGRAPHY

## 2016-11-28 ENCOUNTER — Ambulatory Visit: Payer: BLUE CROSS/BLUE SHIELD

## 2017-02-27 ENCOUNTER — Encounter: Payer: Self-pay | Admitting: Family Medicine

## 2017-02-27 ENCOUNTER — Ambulatory Visit: Payer: BLUE CROSS/BLUE SHIELD | Admitting: Family Medicine

## 2017-02-27 VITALS — BP 140/100 | HR 66 | Temp 98.1°F | Resp 14 | Wt 154.7 lb

## 2017-02-27 DIAGNOSIS — Z5181 Encounter for therapeutic drug level monitoring: Secondary | ICD-10-CM | POA: Diagnosis not present

## 2017-02-27 DIAGNOSIS — J301 Allergic rhinitis due to pollen: Secondary | ICD-10-CM

## 2017-02-27 DIAGNOSIS — R0683 Snoring: Secondary | ICD-10-CM | POA: Diagnosis not present

## 2017-02-27 DIAGNOSIS — I10 Essential (primary) hypertension: Secondary | ICD-10-CM | POA: Diagnosis not present

## 2017-02-27 DIAGNOSIS — E119 Type 2 diabetes mellitus without complications: Secondary | ICD-10-CM | POA: Diagnosis not present

## 2017-02-27 DIAGNOSIS — Z23 Encounter for immunization: Secondary | ICD-10-CM

## 2017-02-27 DIAGNOSIS — E785 Hyperlipidemia, unspecified: Secondary | ICD-10-CM | POA: Diagnosis not present

## 2017-02-27 MED ORDER — LISINOPRIL-HYDROCHLOROTHIAZIDE 10-12.5 MG PO TABS: 1.5000 | ORAL_TABLET | Freq: Every day | ORAL | 1 refills | Status: AC

## 2017-02-27 MED ORDER — METFORMIN HCL 500 MG PO TABS: 500.0000 mg | ORAL_TABLET | Freq: Every day | ORAL | 1 refills | Status: AC

## 2017-02-27 NOTE — Assessment & Plan Note (Signed)
Not sure if sleep apnea, offered evaluation for OSA; she will consider and let me know; discussed risk of HTN, heart failure

## 2017-02-27 NOTE — Patient Instructions (Signed)
Return in two weeks for a blood pressure check Regrese en dos semanas para medir la presion

## 2017-02-27 NOTE — Progress Notes (Addendum)
BP (!) 140/100   Pulse 66   Temp 98.1 F (36.7 C) (Oral)   Resp 14   Wt 154 lb 11.2 oz (70.2 kg)   SpO2 97%   BMI 30.21 kg/m    Subjective:    Patient ID: Jackie Fox, female    DOB: 21-Jun-1955, 61 y.o.   MRN: [id number]  HPI: Jackie Fox is a 61 y.o. female  Chief Complaint  Patient presents with  . Follow-up    6 months    HPI Patient is here for 6 month f/u; husband and Spanish-speaking interpreter Ronnald Collum) is here as well  Type 2 diabetes; no problems with blurred vision; some dry mouth; gets up to urinate at night, just once Eye exam in July  High blood pressure is high today; she takes her BP medicine in the afternoon, but took it last night at 8 pm Pork taco last night; no decongestants; with questioning, she does snore and may have sleep apnea  Allergies, using nasal spray and that helps a lot  High cholesterol; limiting cheese; not taking medicine for her cholesterol and does not want to; walks at work a lot  Depression screen [facility] 2/9 02/27/2017 10/24/2016 08/22/2016 04/04/2016 02/22/2016  Decreased Interest 0 0 0 0 0  Down, Depressed, Hopeless 0 0 0 0 0  PHQ - 2 Score 0 0 0 0 0    Relevant past medical, surgical, family and social history reviewed Past Medical History:  Diagnosis Date  . Diabetes mellitus without complication ([place])   . Hyperlipidemia LDL goal <70 12/22/2014  . Hypertension   . Migraine   . Ovarian cancer [facility]) 2005   hysterectomy and chemo x 4   Past Surgical History:  Procedure Laterality Date  . ABDOMINAL HYSTERECTOMY  2005  . BREAST BIOPSY Left 1990  . COLONOSCOPY WITH PROPOFOL N/A 04/17/2016   Performed by Jonathon Bellows, MD at [facility] ENDOSCOPY   Family History  Problem Relation Age of Onset  . Hyperlipidemia Mother   . Heart disease Mother   . Hypertension Mother   . Liver cancer Father   . Breast cancer Neg Hx    Social History   Socioeconomic History  . Marital status: Married    Spouse name: Not on file  . Number of  children: Not on file  . Years of education: Not on file  . Highest education level: Not on file  Social Needs  . Financial resource strain: Not on file  . Food insecurity - worry: Not on file  . Food insecurity - inability: Not on file  . Transportation needs - medical: Not on file  . Transportation needs - non-medical: Not on file  Occupational History  . Not on file  Tobacco Use  . Smoking status: Never Smoker  . Smokeless tobacco: Never Used  Substance and Sexual Activity  . Alcohol use: No    Alcohol/week: 0.0 oz  . Drug use: No  . Sexual activity: Yes    Partners: Male  Other Topics Concern  . Not on file  Social History Narrative  . Not on file   Interim medical history since last visit reviewed. Allergies and medications reviewed  Review of Systems Per HPI unless specifically indicated above     Objective:    BP (!) 140/100   Pulse 66   Temp 98.1 F (36.7 C) (Oral)   Resp 14   Wt 154 lb 11.2 oz (70.2 kg)   SpO2 97%   BMI 30.21 kg/m  Wt Readings from Last 3 Encounters:  02/27/17 154 lb 11.2 oz (70.2 kg)  10/24/16 160 lb 11.2 oz (72.9 kg)  08/22/16 153 lb 6.4 oz (69.6 kg)    Physical Exam  Constitutional: She appears well-developed and well-nourished.  HENT:  Head: Normocephalic and atraumatic.  Mouth/Throat: Oropharynx is clear and moist and mucous membranes are normal.  Eyes: EOM are normal. No scleral icterus.  Cardiovascular: Normal rate and regular rhythm.  Pulmonary/Chest: Effort normal and breath sounds normal.  Abdominal: Soft. Bowel sounds are normal. She exhibits no distension. There is no tenderness.  Musculoskeletal: She exhibits no edema.  Neurological: She is alert.  Psychiatric: She has a normal mood and affect. Her behavior is normal.   Diabetic Foot Form - Detailed   Diabetic Foot Exam - detailed Diabetic Foot exam was performed with the following findings:  Yes 02/27/2017 10:39 AM  Visual Foot Exam completed.:  Yes  Pulse Foot  Exam completed.:  Yes  Right Dorsalis Pedis:  Present Left Dorsalis Pedis:  Present  Sensory Foot Exam Completed.:  Yes Semmes-Weinstein Monofilament Test R Site 1-Great Toe:  Pos L Site 1-Great Toe:  Pos        Results for orders placed or performed in visit on 11/22/16  HM MAMMOGRAPHY  Result Value Ref Range   HM Mammogram 0-4 Bi-Rad 0-4 Bi-Rad, Self Reported Normal      Assessment & Plan:   Problem List Items Addressed This Visit      Cardiovascular and Mediastinum   HTN (hypertension) (Chronic)    Increase medicine; check BP here in 2 weeks      Relevant Medications   lisinopril-hydrochlorothiazide (PRINZIDE,ZESTORETIC) 10-12.5 MG tablet     Respiratory   Seasonal allergic rhinitis due to pollen    Nasal spray working well        Endocrine   Controlled diabetes mellitus type II without complication (HCC) - Primary (Chronic)    Check labs today; patient requests that we call her son with the results      Relevant Medications   metFORMIN (GLUCOPHAGE) 500 MG tablet   lisinopril-hydrochlorothiazide (PRINZIDE,ZESTORETIC) 10-12.5 MG tablet   Other Relevant Orders   Microalbumin / creatinine urine ratio   Lipid panel   Hemoglobin A1c     Other   Medication monitoring encounter   Relevant Orders   COMPLETE METABOLIC PANEL WITH GFR   Snoring    Not sure if sleep apnea, offered evaluation for OSA; she will consider and let me know; discussed risk of HTN, heart failure      Hyperlipidemia LDL goal <70    Check fasting lipids; she does NOT want statin      Relevant Medications   lisinopril-hydrochlorothiazide (PRINZIDE,ZESTORETIC) 10-12.5 MG tablet   Other Relevant Orders   Lipid panel    Other Visit Diagnoses    Needs flu shot       Relevant Orders   Flu Vaccine QUAD 6+ mos PF IM (Fluarix Quad PF) (Completed)   Hypertension goal BP (blood pressure) < 140/90       Relevant Medications   lisinopril-hydrochlorothiazide (PRINZIDE,ZESTORETIC) 10-12.5 MG tablet        Follow up plan: Return in about 6 months (around 08/27/2017) for twenty minute follow-up with fasting labs.  An after-visit summary was printed and given to the patient at [place].  Please see the patient instructions which may contain other information and recommendations beyond what is mentioned above in the assessment and plan.  Meds ordered this  encounter  Medications  . metFORMIN (GLUCOPHAGE) 500 MG tablet    Sig: Take 1 tablet (500 mg total) by mouth daily.    Dispense:  90 tablet    Refill:  1  . lisinopril-hydrochlorothiazide (PRINZIDE,ZESTORETIC) 10-12.5 MG tablet    Sig: Take 1.5 tablets by mouth daily.    Dispense:  135 tablet    Refill:  1    New instructions    Orders Placed This Encounter  Procedures  . Flu Vaccine QUAD 6+ mos PF IM (Fluarix Quad PF)  . Microalbumin / creatinine urine ratio  . Lipid panel  . Hemoglobin A1c  . COMPLETE METABOLIC PANEL WITH GFR

## 2017-02-27 NOTE — Assessment & Plan Note (Signed)
Increase medicine; check BP here in 2 weeks

## 2017-02-27 NOTE — Assessment & Plan Note (Addendum)
Check fasting lipids; she does NOT want statin

## 2017-02-27 NOTE — Addendum Note (Signed)
Addended by: LADA, Satira Anis on: 02/27/2017 11:18 AM   Modules accepted: Orders

## 2017-02-27 NOTE — Assessment & Plan Note (Addendum)
Check labs today; patient requests that we call her son with the results

## 2017-02-27 NOTE — Assessment & Plan Note (Signed)
Nasal spray working well

## 2017-02-28 LAB — COMPLETE METABOLIC PANEL WITH GFR
AG RATIO: 1.5 (calc) (ref 1.0–2.5)
ALBUMIN MSPROF: 4 g/dL (ref 3.6–5.1)
ALKALINE PHOSPHATASE (APISO): 58 U/L (ref 33–130)
ALT: 17 U/L (ref 6–29)
AST: 16 U/L (ref 10–35)
BILIRUBIN TOTAL: 0.7 mg/dL (ref 0.2–1.2)
BUN / CREAT RATIO: 27 (calc) — AB (ref 6–22)
BUN: 12 mg/dL (ref 7–25)
CHLORIDE: 105 mmol/L (ref 98–110)
CO2: 33 mmol/L — ABNORMAL HIGH (ref 20–32)
Calcium: 9.1 mg/dL (ref 8.6–10.4)
Creat: 0.44 mg/dL — ABNORMAL LOW (ref 0.50–0.99)
GFR, Est African American: 125 mL/min/{1.73_m2} (ref >=60)
GFR, Est Non African American: 107 mL/min/{1.73_m2} (ref >=60)
GLUCOSE: 99 mg/dL (ref 65–99)
Globulin: 2.6 g/dL (calc) (ref 1.9–3.7)
POTASSIUM: 4.3 mmol/L (ref 3.5–5.3)
Sodium: 142 mmol/L (ref 135–146)
Total Protein: 6.6 g/dL (ref 6.1–8.1)

## 2017-02-28 LAB — HEMOGLOBIN A1C
EAG (MMOL/L): 6.6 (calc)
HEMOGLOBIN A1C: 5.8 %{Hb} — AB (ref <5.7)
MEAN PLASMA GLUCOSE: 120 (calc)

## 2017-02-28 LAB — LIPID PANEL
Cholesterol: 223 mg/dL — ABNORMAL HIGH (ref <200)
HDL: 46 mg/dL — ABNORMAL LOW (ref >50)
LDL CHOLESTEROL (CALC): 150 mg/dL — AB
Non-HDL Cholesterol (Calc): 177 mg/dL (calc) — ABNORMAL HIGH (ref <130)
TRIGLYCERIDES: 147 mg/dL (ref <150)
Total CHOL/HDL Ratio: 4.8 (calc) (ref <5.0)

## 2017-02-28 LAB — MICROALBUMIN / CREATININE URINE RATIO
Creatinine, Urine: 104 mg/dL (ref 20–275)
MICROALB UR: 0.4 mg/dL
MICROALB/CREAT RATIO: 4 ug/mg{creat} (ref <30)

## 2017-03-04 ENCOUNTER — Encounter: Payer: Self-pay | Admitting: Family Medicine

## 2017-05-27 NOTE — Progress Notes (Signed)
Closing out lab/order note open since:  May 2018

## 2017-06-25 ENCOUNTER — Encounter: Payer: Self-pay | Admitting: Family Medicine

## 2017-06-25 ENCOUNTER — Ambulatory Visit: Payer: BLUE CROSS/BLUE SHIELD | Admitting: Family Medicine

## 2017-06-25 ENCOUNTER — Other Ambulatory Visit: Payer: Self-pay | Admitting: Family Medicine

## 2017-06-25 VITALS — BP 124/82 | HR 69 | Temp 98.1°F | Resp 14 | Wt 156.8 lb

## 2017-06-25 DIAGNOSIS — R062 Wheezing: Secondary | ICD-10-CM

## 2017-06-25 DIAGNOSIS — J069 Acute upper respiratory infection, unspecified: Secondary | ICD-10-CM

## 2017-06-25 MED ORDER — ALBUTEROL SULFATE HFA 108 (90 BASE) MCG/ACT IN AERS: 2.0000 | INHALATION_SPRAY | RESPIRATORY_TRACT | 1 refills | Status: AC | PRN

## 2017-06-25 MED ORDER — BENZONATATE 100 MG PO CAPS: 100.0000 mg | ORAL_CAPSULE | Freq: Three times a day (TID) | ORAL | 0 refills | Status: AC | PRN

## 2017-06-25 NOTE — Telephone Encounter (Signed)
rx approved

## 2017-06-25 NOTE — Progress Notes (Signed)
BP 124/82   Pulse 69   Temp 98.1 F (36.7 C) (Oral)   Resp 14   Wt 156 lb 12.8 oz (71.1 kg)   SpO2 96%   BMI 30.62 kg/m    Subjective:    Patient ID: Jackie Fox, female    DOB: March 23, 1956, 62 y.o.   MRN: [id number]  HPI: Jackie Fox is a 62 y.o. female  Chief Complaint  Patient presents with  . URI    onset 1 week symptoms include cough, fever at night, sore throat and congestion    HPI She got sick on Wednesday last week Lots of cough Sore throat, lots of itching; wheezing Not as much mucous; just a little bit of body aches at the beginning Fever in the evening, not very high; chills Medicines tried: Nyquil Sick contacts: no She did get the flu shot this past season Just a little chest pain when she coughs, points over anterior chest  Depression screen [facility] 2/9 06/25/2017 02/27/2017 10/24/2016 08/22/2016 04/04/2016  Decreased Interest 0 0 0 0 0  Down, Depressed, Hopeless 0 0 0 0 0  PHQ - 2 Score 0 0 0 0 0    Relevant past medical, surgical, family and social history reviewed Past Medical History:  Diagnosis Date  . Diabetes mellitus without complication ([place])   . Hyperlipidemia LDL goal <70 12/22/2014  . Hypertension   . Migraine   . Ovarian cancer [facility]) 2005   hysterectomy and chemo x 4   Past Surgical History:  Procedure Laterality Date  . ABDOMINAL HYSTERECTOMY  2005  . BREAST BIOPSY Left 1990  . COLONOSCOPY WITH PROPOFOL N/A 04/17/2016   Procedure: COLONOSCOPY WITH PROPOFOL;  Surgeon: Jonathon Bellows, MD;  Location: [facility] ENDOSCOPY;  Service: Endoscopy;  Laterality: N/A;   Family History  Problem Relation Age of Onset  . Hyperlipidemia Mother   . Heart disease Mother   . Hypertension Mother   . Liver cancer Father   . Breast cancer Neg Hx    Social History   Tobacco Use  . Smoking status: Never Smoker  . Smokeless tobacco: Never Used  Substance Use Topics  . Alcohol use: No    Alcohol/week: 0.0 oz  . Drug use: No    Interim medical history  since last visit reviewed. Allergies and medications reviewed  Review of Systems  HENT: Positive for rhinorrhea.   Respiratory: Positive for cough and wheezing.   Gastrointestinal: Negative for abdominal pain, diarrhea and nausea.   Per HPI unless specifically indicated above     Objective:    BP 124/82   Pulse 69   Temp 98.1 F (36.7 C) (Oral)   Resp 14   Wt 156 lb 12.8 oz (71.1 kg)   SpO2 96%   BMI 30.62 kg/m   Wt Readings from Last 3 Encounters:  06/25/17 156 lb 12.8 oz (71.1 kg)  02/27/17 154 lb 11.2 oz (70.2 kg)  10/24/16 160 lb 11.2 oz (72.9 kg)    Physical Exam  Constitutional: She appears well-developed and well-nourished.  HENT:  Right Ear: Tympanic membrane is erythematous (very mild).  Nose: Rhinorrhea (clear) present.  Mouth/Throat: Mucous membranes are normal. No posterior oropharyngeal edema or posterior oropharyngeal erythema.  Canal has some cerumen in the left canal  Eyes: EOM are normal. No scleral icterus.  Cardiovascular: Normal rate and regular rhythm.  Pulmonary/Chest: Effort normal and breath sounds normal.  Lymphadenopathy:    She has no cervical adenopathy.  Skin: She is not diaphoretic. No  pallor.  Psychiatric: She has a normal mood and affect. Her behavior is normal. Her mood appears not anxious. She does not exhibit a depressed mood.    Results for orders placed or performed in visit on 02/27/17  Microalbumin / creatinine urine ratio  Result Value Ref Range   Creatinine, Urine 104 20 - 275 mg/dL   Microalb, Ur 0.4 mg/dL   Microalb Creat Ratio 4 <30 mcg/mg creat  Lipid panel  Result Value Ref Range   Cholesterol 223 (H) <200 mg/dL   HDL 46 (L) >50 mg/dL   Triglycerides 147 <150 mg/dL   LDL Cholesterol (Calc) 150 (H) mg/dL (calc)   Total CHOL/HDL Ratio 4.8 <5.0 (calc)   Non-HDL Cholesterol (Calc) 177 (H) <130 mg/dL (calc)  Hemoglobin A1c  Result Value Ref Range   Hgb A1c MFr Bld 5.8 (H) <5.7 % of total Hgb   Mean Plasma Glucose 120  (calc)   eAG (mmol/L) 6.6 (calc)  COMPLETE METABOLIC PANEL WITH GFR  Result Value Ref Range   Glucose, Bld 99 65 - 99 mg/dL   BUN 12 7 - 25 mg/dL   Creat 0.44 (L) 0.50 - 0.99 mg/dL   GFR, Est Non African American 107 > OR = 60 mL/min/1.36m2   GFR, Est African American 125 > OR = 60 mL/min/1.37m2   BUN/Creatinine Ratio 27 (H) 6 - 22 (calc)   Sodium 142 135 - 146 mmol/L   Potassium 4.3 3.5 - 5.3 mmol/L   Chloride 105 98 - 110 mmol/L   CO2 33 (H) 20 - 32 mmol/L   Calcium 9.1 8.6 - 10.4 mg/dL   Total Protein 6.6 6.1 - 8.1 g/dL   Albumin 4.0 3.6 - 5.1 g/dL   Globulin 2.6 1.9 - 3.7 g/dL (calc)   AG Ratio 1.5 1.0 - 2.5 (calc)   Total Bilirubin 0.7 0.2 - 1.2 mg/dL   Alkaline phosphatase (APISO) 58 33 - 130 U/L   AST 16 10 - 35 U/L   ALT 17 6 - 29 U/L      Assessment & Plan:   Problem List Items Addressed This Visit    None    Visit Diagnoses    Wheezing    -  Primary   with cough; tessalon perles, SABA   Viral upper respiratory tract infection       viral; antibiotics not indicated; rest, hydration, see AVS; contagious for avoid crowds       Follow up plan: No Follow-up on file.  An after-visit summary was printed and given to the patient at [facility].  Please see the patient instructions which may contain other information and recommendations beyond what is mentioned above in the assessment and plan.  Meds ordered this encounter  Medications  . albuterol (PROVENTIL HFA;VENTOLIN HFA) 108 (90 Base) MCG/ACT inhaler    Sig: Inhale 2 puffs into the lungs every 4 (four) hours as needed for wheezing or shortness of breath.    Dispense:  1 Inhaler    Refill:  1  . benzonatate (TESSALON PERLES) 100 MG capsule    Sig: Take 1 capsule (100 mg total) by mouth every 8 (eight) hours as needed for cough.    Dispense:  30 capsule    Refill:  0    No orders of the defined types were placed in this encounter.

## 2017-06-25 NOTE — Patient Instructions (Addendum)
Upper Respiratory Infection, Adult Most upper respiratory infections (URIs) are caused by a virus. A URI affects the nose, throat, and upper air passages. The most common type of URI is often called "the common cold." Follow these instructions at home:  Take medicines only as told by your doctor.  Gargle warm saltwater or take cough drops to comfort your throat as told by your doctor.  Use a warm mist humidifier or inhale steam from a shower to increase air moisture. This may make it easier to breathe.  Drink enough fluid to keep your pee (urine) clear or pale yellow.  Eat soups and other clear broths.  Have a healthy diet.  Rest as needed.  Go back to work when your fever is gone or your doctor says it is okay. ? You may need to stay home longer to avoid giving your URI to others. ? You can also wear a face mask and wash your hands often to prevent spread of the virus.  Use your inhaler more if you have asthma.  Do not use any tobacco products, including cigarettes, chewing tobacco, or electronic cigarettes. If you need help quitting, ask your doctor. Contact a doctor if:  You are getting worse, not better.  Your symptoms are not helped by medicine.  You have chills.  You are getting more short of breath.  You have brown or red mucus.  You have yellow or brown discharge from your nose.  You have pain in your face, especially when you bend forward.  You have a fever.  You have puffy (swollen) neck glands.  You have pain while swallowing.  You have white areas in the back of your throat. Get help right away if:  You have very bad or constant: ? Headache. ? Ear pain. ? Pain in your forehead, behind your eyes, and over your cheekbones (sinus pain). ? Chest pain.  You have long-lasting (chronic) lung disease and any of the following: ? Wheezing. ? Long-lasting cough. ? Coughing up blood. ? A change in your usual mucus.  You have a stiff neck.  You have  changes in your: ? Vision. ? Hearing. ? Thinking. ? Mood. This information is not intended to replace advice given to you by your health care provider. Make sure you discuss any questions you have with your health care provider. Document Released: 09/13/2007 Document Revised: 11/28/2015 Document Reviewed: 07/02/2013 Elsevier Interactive Patient Education  2018 [employer]  

## 2017-08-09 IMAGING — CT CT ABD-PELV W/ CM
2 of 5 series · 14 of 46 positions shown, 16 images · IV contrast (APPLIED)
Comparison: 02/29/2016 and 03/25/2007.  Renal ultrasound 04/11/2016

CLINICAL DATA: 60-year-old with uterine cancer in 2883 and
hysterectomy. Recent colonoscopy. Follow-up of indeterminate area in
the liver.

EXAM:
CT ABDOMEN AND PELVIS WITH CONTRAST
TECHNIQUE: Multidetector CT imaging of the abdomen and pelvis was performed
using the standard protocol following bolus administration of
intravenous contrast.
CONTRAST:  100mL A8DEWY-ERR IOPAMIDOL (A8DEWY-ERR) INJECTION 61%

[Series 2: axial st · axial · 0.76mm/px · z∈[-431,-21]mm · 11 of 94 slices shown, 13 images]
[im 6/94  soft-tissue]
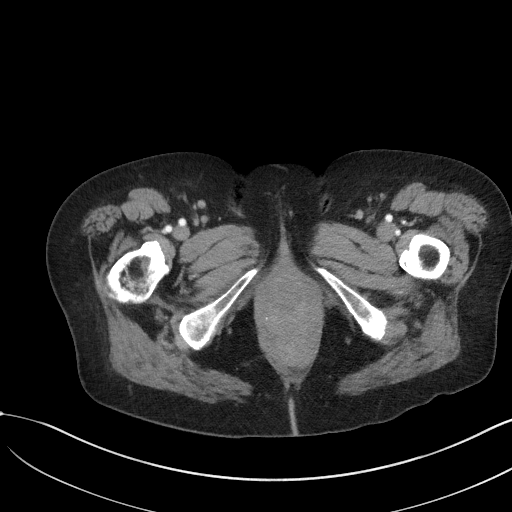
[im 6/94  bone]
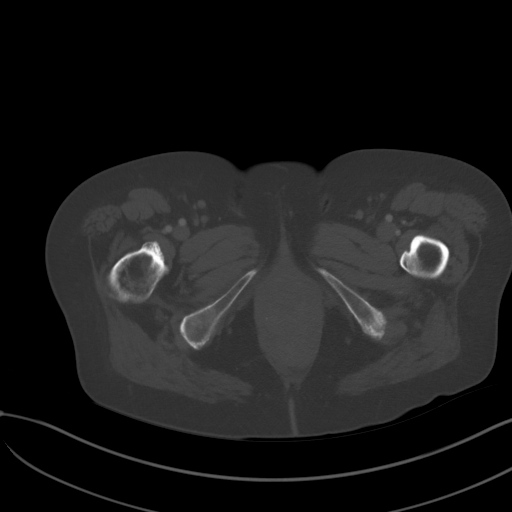
[im 17/94  soft-tissue]
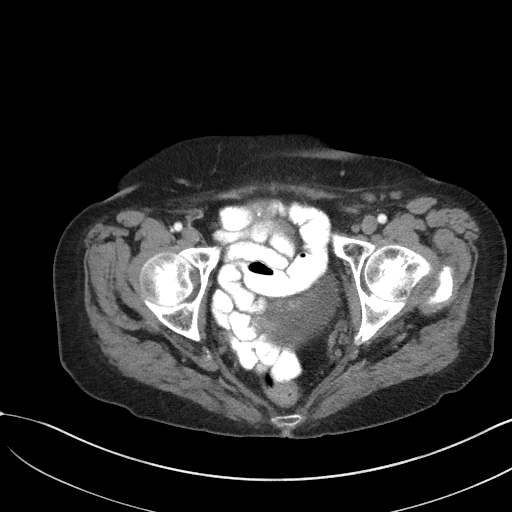
[im 22/94  soft-tissue]
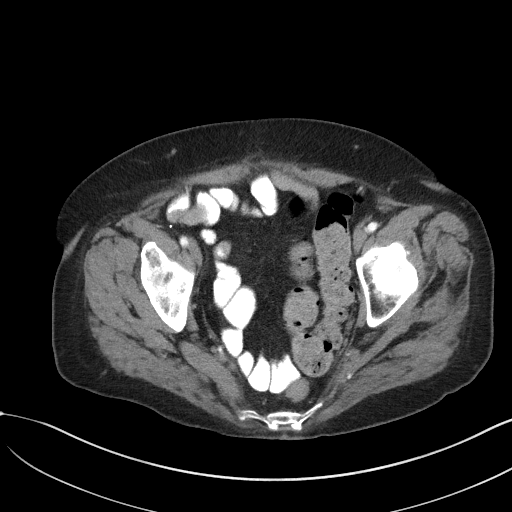
[im 33/94  soft-tissue]
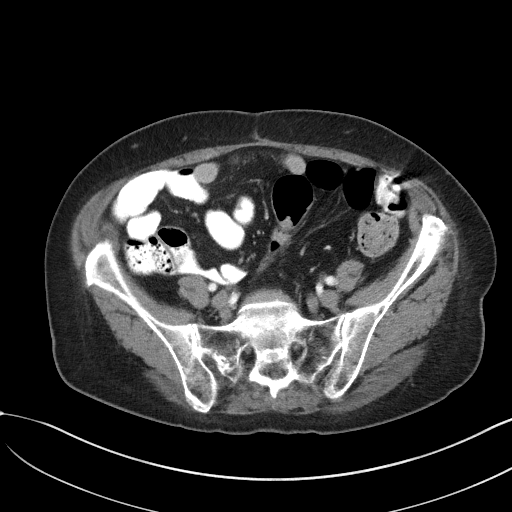
[im 39/94  soft-tissue]
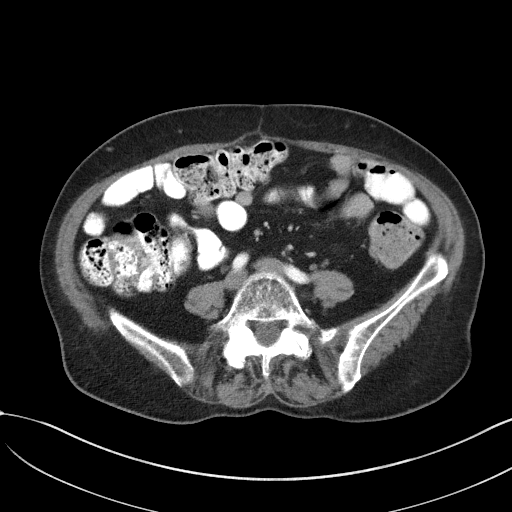
[im 50/94  soft-tissue]
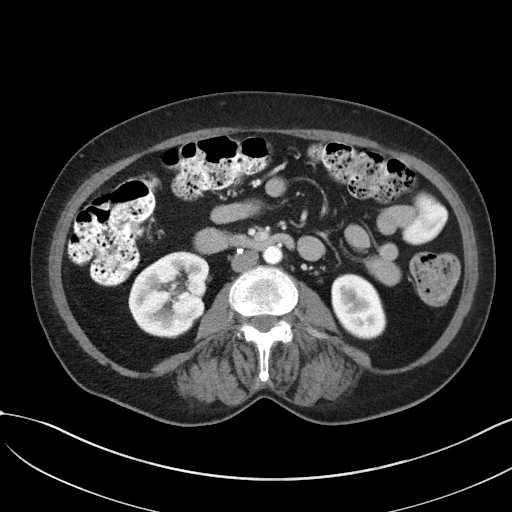
[im 55/94  soft-tissue]
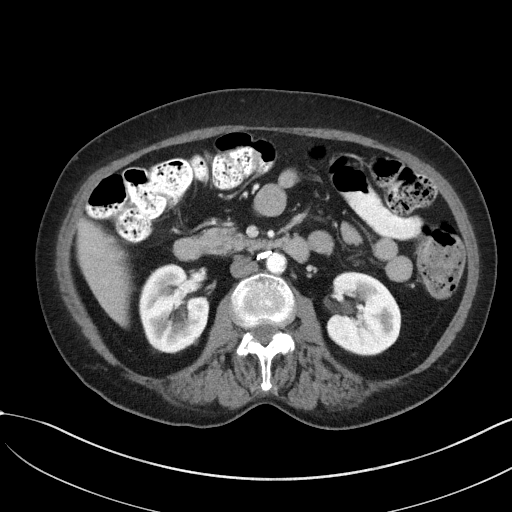
[im 61/94  soft-tissue]
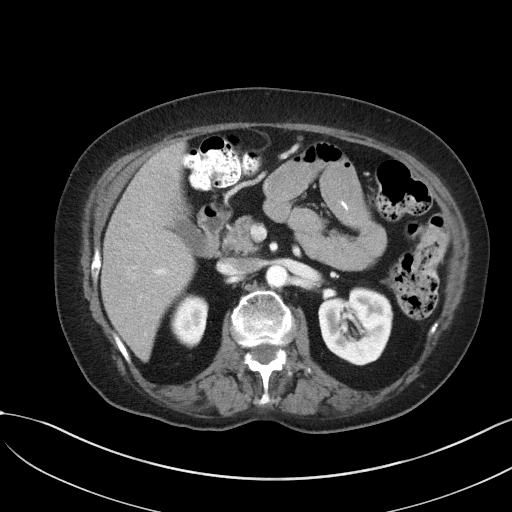
[im 72/94  soft-tissue]
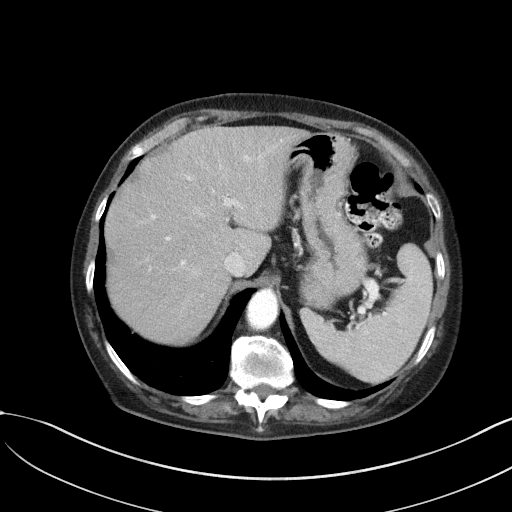
[im 72/94  bone]
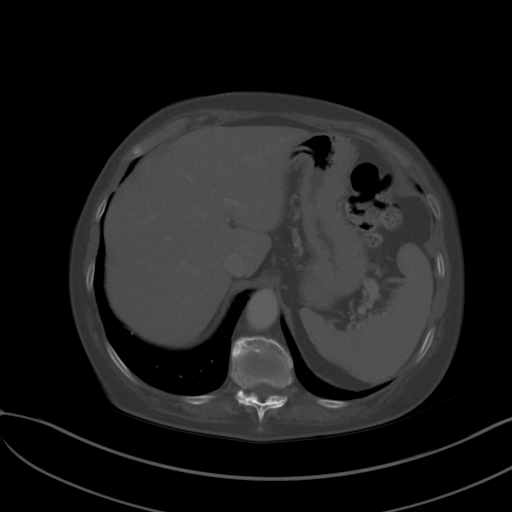
[im 77/94  soft-tissue]
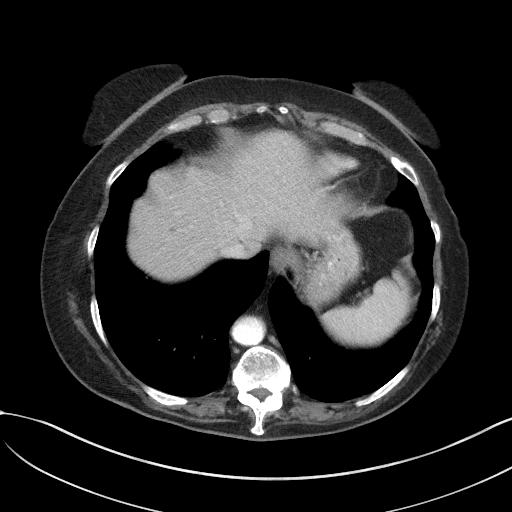
[im 88/94  soft-tissue]
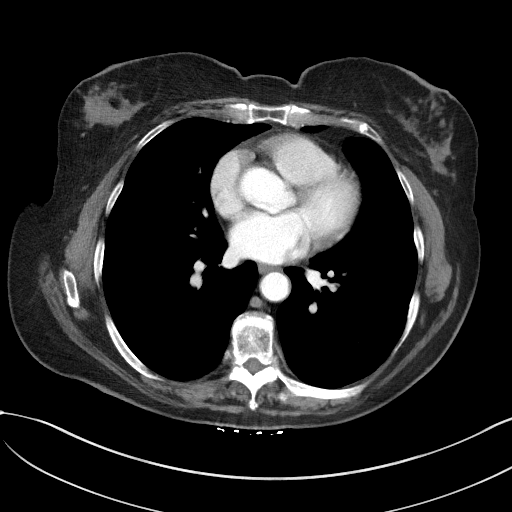

[Series 5: coronal st · coronal · 0.72mm/px · 3 of 92 slices shown]
[im 31/92  soft-tissue]
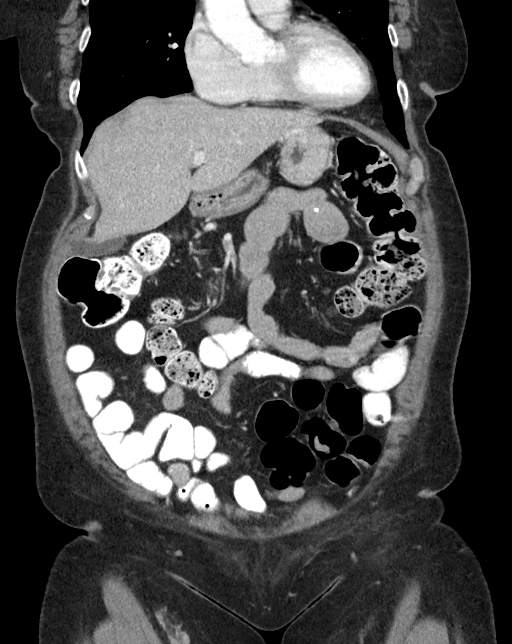
[im 41/92  soft-tissue]
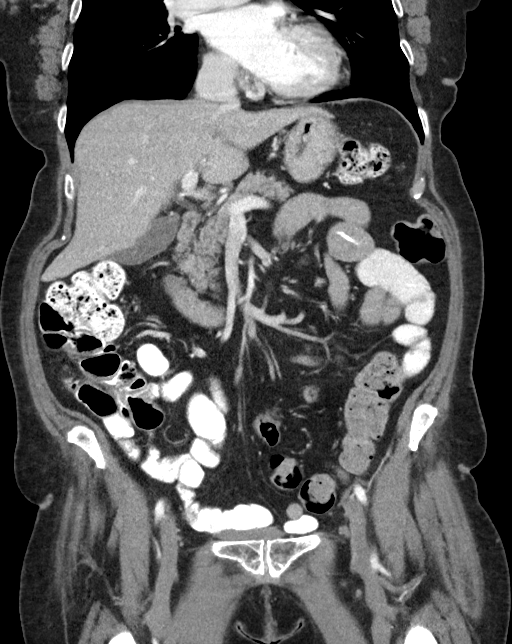
[im 51/92  soft-tissue]
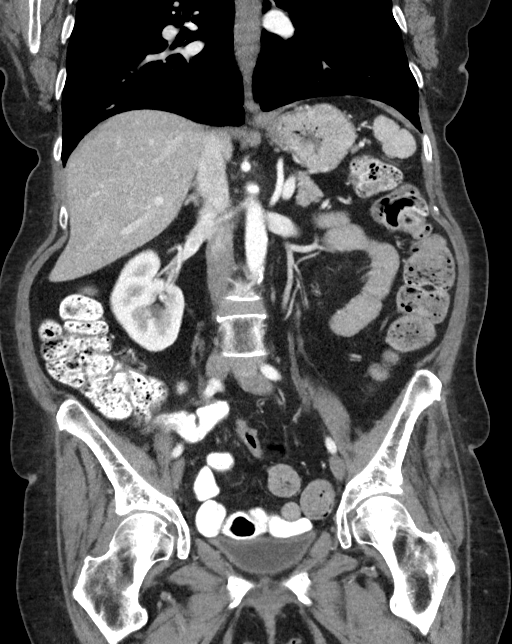

[14 of 46 positions shown; findings below may reference images not displayed]

FINDINGS: Lower chest: There are numerous small nodules at both lung bases,
many of these nodules are pleural-based. Majority of the nodules are
unchanged since 02/29/2016. Some of the nodules near the right minor
fissure were not imaged on the previous examination. No pleural
effusions. Largest nodule measures 4 mm in left lower lobe on
sequence 4, image 13. There are at least 3 subtle ground-glass
densities in the right lower lobe best seen on the coronal formats,
sequence 5, image 66. These ground-glass areas are poorly defined,
largest measuring roughly 1 cm. These areas in the right lower lobe
were not imaged on the previous examination.

Hepatobiliary: Again noted is a 5 mm low density at the right
hepatic dome on sequence 2, image 22 which is unchanged. There are
at least 2 additional other punctate low densities at the hepatic
dome which are unchanged. No suspicious liver lesion. The portal
venous system is patent. Gallbladder is normal without biliary
dilatation.

Pancreas: Normal appearance of the pancreas without inflammation or
duct dilatation.

Spleen: Normal appearance of spleen without enlargement.

Adrenals/Urinary Tract: Normal adrenal glands. Again noted are small
bilateral renal cysts. Largest cyst is exophytic in the posterior
right kidney measuring 1.9 cm. The Hounsfield units measure roughly
26 and not significantly changed on the delayed images. This was
found to be a simple cyst on recent ultrasound. The cyst has
enlarged since 0116 but no significant changed since 02/29/2016. No
hydronephrosis. Urinary bladder is unremarkable.

Stomach/Bowel: There is a thin wavy radiopaque density within a loop
of jejunum in the left upper abdomen on sequence 2, image 34.
Structure measures at least 5 cm in length and compatible with some
type of ingested material. This was not present on the recent
comparison examination. Normal appearance of stomach and duodenum.
No evidence for bowel dilatation, obstruction or focal inflammation.

Vascular/Lymphatic: Mild atherosclerotic disease in the aorta
without aneurysm. There is no significant lymph node enlargement in
the abdomen or pelvis. Again noted are surgical clips between the
aorta and IVC.

Reproductive: Status post hysterectomy. No adnexal masses.

Other: No abdominal or pelvic ascites.  Negative for free air.

Musculoskeletal: Persistent sclerosis and irregularity involving the
posterior left iliac bone. Facet arthropathy in the lower lumbar
spine. Grade 1 anterolisthesis at L4-L5. Stable compression
deformities at T11 and T12.
IMPRESSION: No acute abnormality in the abdomen or pelvis.

Subtle ground-glass opacities in the right lower lobe, particularly
in the superior segment. These probably represent a subtle
infectious or inflammatory process. Recommend clinical correlation.
Non-contrast chest CT at 3-6 months is recommended. If nodules
persist, subsequent management will be based upon the most
suspicious nodule(s). This recommendation follows the consensus
statement: Guidelines for Management of Incidental Pulmonary Nodules
Detected on CT Images: From the [HOSPITAL] 5904; Radiology
5904; [DATE].

Stable low-density areas at the hepatic dome. These probably
represent benign etiologies such as small cysts but too small to
definitively characterize.

Bilateral renal cysts.

Multiple small pulmonary nodules at the lung bases. Many of these
small pulmonary nodules are unchanged. Some of the nodules were not
imaged on the prior examinations. Largest nodule measures 4 mm. No
follow-up needed if patient is low-risk (and has no known or
suspected primary neoplasm). Non-contrast chest CT can be considered
in 12 months if patient is high-risk. This recommendation follows
the consensus statement: Guidelines for Management of Incidental
Pulmonary Nodules Detected on CT Images: From the [HOSPITAL]

Thin radiopaque density measuring roughly 5 cm within the small
bowel. This probably represents ingested material.

## 2017-08-28 ENCOUNTER — Ambulatory Visit: Payer: BLUE CROSS/BLUE SHIELD | Admitting: Family Medicine
